# Patient Record
Sex: Female | Born: 1980 | Race: White | Hispanic: No | Marital: Married | Smoking: Never smoker
Health system: Southern US, Community
[De-identification: ages and names within clinical notes are randomized; demographics above are authoritative.]

## PROBLEM LIST (undated history)

## (undated) DIAGNOSIS — K602 Anal fissure, unspecified: Secondary | ICD-10-CM

## (undated) DIAGNOSIS — R011 Cardiac murmur, unspecified: Secondary | ICD-10-CM

## (undated) DIAGNOSIS — D649 Anemia, unspecified: Secondary | ICD-10-CM

## (undated) DIAGNOSIS — D696 Thrombocytopenia, unspecified: Secondary | ICD-10-CM

## (undated) DIAGNOSIS — Z9289 Personal history of other medical treatment: Secondary | ICD-10-CM

## (undated) DIAGNOSIS — A491 Streptococcal infection, unspecified site: Secondary | ICD-10-CM

## (undated) HISTORY — DX: Cardiac murmur, unspecified: R01.1

## (undated) HISTORY — DX: Anemia, unspecified: D64.9

## (undated) HISTORY — DX: Streptococcal infection, unspecified site: A49.1

## (undated) HISTORY — DX: Personal history of other medical treatment: Z92.89

## (undated) HISTORY — PX: WISDOM TOOTH EXTRACTION: SHX21

## (undated) HISTORY — DX: Anal fissure, unspecified: K60.2

---

## 1999-05-23 DIAGNOSIS — Z9289 Personal history of other medical treatment: Secondary | ICD-10-CM

## 1999-05-23 HISTORY — DX: Personal history of other medical treatment: Z92.89

## 2009-11-27 ENCOUNTER — Emergency Department (HOSPITAL_COMMUNITY): Admission: EM | Admit: 2009-11-27 | Discharge: 2009-11-27 | Payer: Self-pay | Admitting: Family Medicine

## 2009-12-05 ENCOUNTER — Emergency Department (HOSPITAL_COMMUNITY): Admission: EM | Admit: 2009-12-05 | Discharge: 2009-12-05 | Payer: Self-pay | Admitting: Family Medicine

## 2010-05-22 DIAGNOSIS — A491 Streptococcal infection, unspecified site: Secondary | ICD-10-CM

## 2010-05-22 HISTORY — DX: Streptococcal infection, unspecified site: A49.1

## 2010-10-15 ENCOUNTER — Inpatient Hospital Stay (HOSPITAL_COMMUNITY)
Admission: AD | Admit: 2010-10-15 | Discharge: 2010-10-17 | DRG: 373 | Disposition: A | Discharge status: Home / Self Care | Payer: BC Managed Care – PPO | Source: Ambulatory Visit | Attending: Obstetrics and Gynecology | Admitting: Obstetrics and Gynecology

## 2010-10-15 DIAGNOSIS — Z22330 Carrier of Group B streptococcus: Secondary | ICD-10-CM

## 2010-10-15 DIAGNOSIS — O99892 Other specified diseases and conditions complicating childbirth: Secondary | ICD-10-CM | POA: Diagnosis present

## 2010-10-15 LAB — CBC
HCT: 40.4 % (ref 36.0–46.0)
Hemoglobin: 14.4 g/dL (ref 12.0–15.0)
MCHC: 35.6 g/dL (ref 30.0–36.0)
MCV: 89.2 fL (ref 78.0–100.0)
RDW: 12.5 % (ref 11.5–15.5)
WBC: 12 10*3/uL — ABNORMAL HIGH (ref 4.0–10.5)

## 2010-10-16 LAB — CBC
HCT: 35.7 % — ABNORMAL LOW (ref 36.0–46.0)
MCH: 31 pg (ref 26.0–34.0)
MCV: 90.6 fL (ref 78.0–100.0)
Platelets: 148 10*3/uL — ABNORMAL LOW (ref 150–400)
RBC: 3.94 MIL/uL (ref 3.87–5.11)
WBC: 12.1 10*3/uL — ABNORMAL HIGH (ref 4.0–10.5)

## 2011-08-31 ENCOUNTER — Telehealth: Payer: Self-pay | Admitting: Obstetrics and Gynecology

## 2011-08-31 NOTE — Telephone Encounter (Signed)
PT CALLED REQUESTING RX FOR PROCTO CREAM HC, PT ADVISED NEEDS AN APPT BEFORE FILLING, HAS NOT BEEN SEEN IN OFFICE SINCE PP VISIT 01/2011. PT SAYS WILL CB IF APPT WANTED

## 2012-01-29 ENCOUNTER — Telehealth: Payer: Self-pay | Admitting: Obstetrics and Gynecology

## 2012-01-30 ENCOUNTER — Ambulatory Visit (INDEPENDENT_AMBULATORY_CARE_PROVIDER_SITE_OTHER): Payer: BC Managed Care – PPO | Admitting: Obstetrics and Gynecology

## 2012-01-30 ENCOUNTER — Encounter: Payer: Self-pay | Admitting: Obstetrics and Gynecology

## 2012-01-30 VITALS — BP 90/62 | HR 70 | Wt 104.0 lb

## 2012-01-30 DIAGNOSIS — A499 Bacterial infection, unspecified: Secondary | ICD-10-CM

## 2012-01-30 DIAGNOSIS — B9689 Other specified bacterial agents as the cause of diseases classified elsewhere: Secondary | ICD-10-CM

## 2012-01-30 DIAGNOSIS — N760 Acute vaginitis: Secondary | ICD-10-CM

## 2012-01-30 DIAGNOSIS — N898 Other specified noninflammatory disorders of vagina: Secondary | ICD-10-CM

## 2012-01-30 DIAGNOSIS — T192XXA Foreign body in vulva and vagina, initial encounter: Secondary | ICD-10-CM

## 2012-01-30 LAB — POCT WET PREP (WET MOUNT)

## 2012-01-30 MED ORDER — FLUCONAZOLE 150 MG PO TABS: 150.0000 mg | ORAL_TABLET | Freq: Once | ORAL | Status: AC

## 2012-01-30 MED ORDER — METRONIDAZOLE 500 MG PO TABS: 500.0000 mg | ORAL_TABLET | Freq: Two times a day (BID) | ORAL | Status: AC

## 2012-01-30 NOTE — Progress Notes (Signed)
Color: CLEAR Odor: yes Itching:no Thin:yes Thick:no Fever:no Dyspareunia:no Hx PID:no HX STD:no Pelvic Pain:no Desires Gc/CT:no Desires HIV,RPR,HbsAG:no    PT STATES THE DISCHARGE STARTED AFTER LAST CYCLE

## 2012-01-30 NOTE — Progress Notes (Signed)
31 YO complains of post menstrual vaginal discharge that is very thin and malodorous. Denies changes in bowel movements, urinary tract symptoms or pelvic pain.   O: Pelvic: EGBUS-wnl, vagina-retained tampon, thin malodorous discharge, uterus-normal size without tenderness, adnexae-no masses or tenderness      Wet Prep: pH-5.5, whiff-positive,  clue cells-many  A: Bacterial Vaginosis     Retained Tampon  P: Metronidazole 500mg  # 14 bid x 7 days no refills      Diflucan 150 mg #1 1 po stat 1 refill      RTO-as scheduled or prn  Mikaya Bunner, PA-C

## 2012-02-16 ENCOUNTER — Encounter: Payer: Self-pay | Admitting: Obstetrics and Gynecology

## 2012-02-16 ENCOUNTER — Ambulatory Visit (INDEPENDENT_AMBULATORY_CARE_PROVIDER_SITE_OTHER): Payer: BC Managed Care – PPO | Admitting: Obstetrics and Gynecology

## 2012-02-16 VITALS — BP 102/60 | Temp 97.7°F | Ht 63.5 in | Wt 107.0 lb

## 2012-02-16 DIAGNOSIS — Z124 Encounter for screening for malignant neoplasm of cervix: Secondary | ICD-10-CM

## 2012-02-16 DIAGNOSIS — Z01419 Encounter for gynecological examination (general) (routine) without abnormal findings: Secondary | ICD-10-CM

## 2012-02-16 NOTE — Patient Instructions (Signed)
Diaphragm A diaphragmis a soft, latex, dome-shaped barrier that is placed in the vagina with spermicidal jelly before sexual intercourse. It covers the cervix, kills sperm, and blocks the passage of sperm into the cervix. This method does not protect against sexually transmitted diseases (STDs). A diaphragm must be fitted by a caregiver during a pelvic exam. A caregiver will measure your vagina prior to prescribing a diaphragm. You will also learn about use, care, and problems of a diaphragm during the exam. If you have significant weight changes or become pregnant, it is important to have the diaphragm rechecked and possibly refitted. The diaphragm should be replaced every 2 years, or sooner if damaged. ADVANTAGES  You can use it while breastfeeding.   It is not felt by your sex partner.   It does not interfere with your female hormones.   It works immediately and is not permanent.  DISADVANTAGES  It is sometimes difficult to insert.   It may shift out of place during sexual intercourse.   You must have it fitted and refitted by your caregiver.  HOW TO INSERT A DIAPHRAGM 1. Check the diaphragm for holes by holding it up to the light, stretching the latex, or by filling it with water.  2. Place the spermicide cream or jelly inside the dome and around the rim of the diaphragm.  3. Squeeze the rim of the diaphragm and insert the diaphragm into the vagina.  4. The opening of the dome should face the cervix while inserting the diaphragm.  5. The front part (or top) of the rim should be behind the pubic bone and pushed over the top of the cervix.  6. Be sure the cervix is completely covered by reaching into your vagina and feeling the cervix behind the latex dome of the diaphragm. If you are uncomfortable, it is not inserted properly. Try inserting it again.  7. Leave the diaphragm in for 6 to 8 hours after intercourse. If intercourse occurs again within these 6 hours, another application of  spermicide is needed.  8. The diaphragm should not be left in place for longer than 24 hours.  HOME CARE INSTRUCTIONS   Wash the diaphragm with mild soap and warm water. Rinse thoroughly and dry completely after every use.   Only use water-based lubricants with the diaphragm. Oil-based lubricants can damage the diaphragm.   Do not use talc on the diaphragm.   Do not use the diaphragm if:   You had a baby in the last 2 months.   You have a vaginal infection.   You are having a menstrual period.   You had recent surgery on your cervix or vagina.   You have vaginal bleeding of unknown cause.   Your sex partner is allergic to latex or spermicides.  SEEK MEDICAL CARE IF:   You have pain during sexual intercourse when using the diaphragm.   The diaphragm slips out of place during sexual intercourse.   You have a fever.   You have blood in your urine.   You have burning or pain when you urinate.   You find a hole in the diaphragm.   You develop abnormal vaginal discharge.   You have vaginal itching or irritation.   You cannot remove the diaphragm.   You think you may be pregnant.   You need to be refitted for a diaphragm.  Document Released: 07/29/2002 Document Revised: 04/27/2011 Document Reviewed: 09/21/2010 [facility] Patient Information 2012 [place], [place].

## 2012-02-16 NOTE — Progress Notes (Signed)
Subjective:    Melanie Macias is a 31 y.o. female, G2P2, who presents for an annual exam. The patient reports no problems but wants to discuss contraceptive options that are not hormonal. Currently uses condoms.  Menstrual cycle:   LMP: Patient's last menstrual period was 02/04/2012.             Review of Systems Pertinent items are noted in HPI. Denies pelvic pain, urinary tract symptoms, vaginitis symptoms, irregular bleeding, menopausal symptoms, change in bowel habits or rectal bleeding   Objective:    BP 102/60  Temp 97.7 F (36.5 C) (Oral)  Ht 5' 3.5" (1.613 m)  Wt 107 lb (48.535 kg)  BMI 18.66 kg/m2  LMP 02/04/2012   Wt Readings from Last 1 Encounters:  02/16/12 107 lb (48.535 kg)   Body mass index is 18.66 kg/(m^2). General Appearance: Alert, no acute distress HEENT: Grossly normal Neck / Thyroid: Supple, no thyromegaly or cervical adenopathy Lungs: Clear to auscultation bilaterally Back: No CVA tenderness Breast Exam: No masses or nodes.No dimpling, nipple retraction or discharge. Cardiovascular: Regular rate and rhythm.  Gastrointestinal: Soft, non-tender, no masses or organomegaly Pelvic Exam: EGBUS-wnl, vagina-normal rugae, cervix- without lesions or tenderness, uterus appears normal size shape and consistency, adnexae-no masses or tenderness Lymphatic Exam: Non-palpable nodes in neck, clavicular,  axillary, or inguinal regions  Skin: no rashes or abnormalities Extremities: no clubbing cyanosis or edema  Neurologic: grossly normal Psychiatric: Alert and oriented    Assessment:   Routine GYN Exam   Plan:    PAP sent  Spent 15 minutes reviewing methods of contraception with patient (in addition to reviewing past and current related medical history) along with effectiveness, MOA, risks and benefits.  To consider diaphragm and Paragard IUD  but planning to become pregnant in the spring.  RTO 1 year or prn  Dove Gresham,ELMIRAPA-C

## 2012-02-16 NOTE — Progress Notes (Signed)
Regular Periods: yes Mammogram: no  Monthly Breast Ex.: yes Exercise: no  Tetanus < 10 years: no Seatbelts: yes  NI. Bladder Functn.: yes Abuse at home: no  Daily BM's: yes Stressful Work: no  Healthy Diet: yes Sigmoid-Colonoscopy: 1994  Calcium: no Medical problems this year: none   LAST PAP: 08/26/2009  Contraception: Condom  Mammogram:  n/a  PCP: [facility], Dr. Hyacinth Meeker  PMH: None  FMH: None  Last Bone Scan: n/a

## 2012-02-19 LAB — PAP IG W/ RFLX HPV ASCU

## 2012-05-20 ENCOUNTER — Ambulatory Visit (INDEPENDENT_AMBULATORY_CARE_PROVIDER_SITE_OTHER): Payer: BC Managed Care – PPO | Admitting: Obstetrics and Gynecology

## 2012-05-20 DIAGNOSIS — Z331 Pregnant state, incidental: Secondary | ICD-10-CM

## 2012-05-20 LAB — OB RESULTS CONSOLE GC/CHLAMYDIA
Chlamydia: NEGATIVE
Gonorrhea: NEGATIVE

## 2012-05-20 LAB — OB RESULTS CONSOLE GBS: GBS: NEGATIVE

## 2012-05-20 MED ORDER — ONDANSETRON 4 MG PO TBDP: 4.0000 mg | ORAL_TABLET | Freq: Three times a day (TID) | ORAL | Status: DC | PRN

## 2012-05-21 LAB — PRENATAL PANEL VII
Antibody Screen: NEGATIVE
Basophils Absolute: 0 10*3/uL (ref 0.0–0.1)
Eosinophils Relative: 1 % (ref 0–5)
HCT: 34.8 % — ABNORMAL LOW (ref 36.0–46.0)
HIV: NONREACTIVE
Hemoglobin: 12.2 g/dL (ref 12.0–15.0)
Lymphocytes Relative: 13 % (ref 12–46)
Lymphs Abs: 1.1 10*3/uL (ref 0.7–4.0)
MCV: 86.6 fL (ref 78.0–100.0)
Monocytes Absolute: 0.5 10*3/uL (ref 0.1–1.0)
Monocytes Relative: 7 % (ref 3–12)
Neutro Abs: 6.5 10*3/uL (ref 1.7–7.7)
RBC: 4.02 MIL/uL (ref 3.87–5.11)
Rh Type: POSITIVE
Rubella: 3.3 Index — ABNORMAL HIGH (ref <0.90)
WBC: 8.1 10*3/uL (ref 4.0–10.5)

## 2012-05-21 LAB — POCT URINALYSIS DIPSTICK
Bilirubin, UA: NEGATIVE
Ketones, UA: NEGATIVE
Leukocytes, UA: NEGATIVE
Spec Grav, UA: 1.02
pH, UA: 5

## 2012-05-21 NOTE — Progress Notes (Signed)
NOB interview completed.  PNV samples given.  Pt doing well. 

## 2012-05-22 NOTE — L&D Delivery Note (Signed)
Delivery Note At 7:04 PM a viable female was delivered via Vaginal, Spontaneous Delivery (Presentation: Left Occiput Anterior).  APGAR: 8, 10; weight pending.   Placenta status: Intact, spontaneously .  Cord:  with the following complications: .  Cord pH: not collected  Delivery done by Haroldine Laws, soon after Sanda Klein took over care and completed placental delivery and repair  Anesthesia: None  Episiotomy: None Lacerations: 2nd degree Suture Repair: 3.0 monocryl Est. Blood Loss (mL): 250  Mom to postpartum.  Baby to skin to skin with FOB while placenta was delivered.  Haroldine Laws 12/23/2012, 7:18 PM

## 2012-06-09 DIAGNOSIS — Z9289 Personal history of other medical treatment: Secondary | ICD-10-CM | POA: Insufficient documentation

## 2012-06-09 DIAGNOSIS — K602 Anal fissure, unspecified: Secondary | ICD-10-CM | POA: Insufficient documentation

## 2012-06-09 DIAGNOSIS — D649 Anemia, unspecified: Secondary | ICD-10-CM | POA: Insufficient documentation

## 2012-06-10 ENCOUNTER — Encounter: Payer: Self-pay | Admitting: Obstetrics and Gynecology

## 2012-06-10 ENCOUNTER — Ambulatory Visit: Payer: BC Managed Care – PPO | Admitting: Obstetrics and Gynecology

## 2012-06-10 VITALS — BP 100/60 | Wt 107.6 lb

## 2012-06-10 DIAGNOSIS — D649 Anemia, unspecified: Secondary | ICD-10-CM

## 2012-06-10 DIAGNOSIS — K602 Anal fissure, unspecified: Secondary | ICD-10-CM

## 2012-06-10 DIAGNOSIS — Z9289 Personal history of other medical treatment: Secondary | ICD-10-CM

## 2012-06-10 DIAGNOSIS — Z3480 Encounter for supervision of other normal pregnancy, unspecified trimester: Secondary | ICD-10-CM

## 2012-06-10 LAB — POCT WET PREP (WET MOUNT): pH: 5

## 2012-06-10 NOTE — Progress Notes (Signed)
Pt is here today for her NOB work-up. Last pap was 02/16/2012 wnl. Pt stated issues today.

## 2012-06-10 NOTE — Progress Notes (Signed)
   Melanie Macias is being seen today for her first obstetrical visit at [redacted]w[redacted]d gestation by LMP.  She reports she is doing well. Other two children are excited about the pregnancy. Planning another waterbirth, discussed that she did not need to go to the waterbirth class.  Her obstetrical history is significant for: Patient Active Problem List  Diagnosis  . H/O transfusion of packed red blood cells  . Hx Anemia  . Anal fissure    Relationship with FOB:  Married to [place], who is involved and supportive She is not employed  Feeding plan:   Breast  Pregnancy history fully reviewed.  The following portions of the patient's history were reviewed and updated as appropriate: allergies, current medications, past family history, past medical history, past social history, past surgical history and problem list.  Review of Systems Pertinent ROS is described in HPI   Objective:   BP 100/60  Wt 107 lb 9.6 oz (48.807 kg)  LMP 03/28/2012  Breastfeeding? Unknown Wt Readings from Last 1 Encounters:  06/10/12 107 lb 9.6 oz (48.807 kg)   BMI: There is no height on file to calculate BMI.  General: alert, cooperative and no distress HEENT: grossly normal  Thyroid: normal  Respiratory: clear to auscultation bilaterally Cardiovascular: regular rate and rhythm,  Breasts:  No dominant masses, nipples erect Gastrointestinal: soft, non-tender; no masses,  no organomegaly Extremities: extremities normal, no pain or edema Vaginal Bleeding: None  EXTERNAL GENITALIA: normal appearing vulva with no masses, tenderness or lesions VAGINA: no abnormal discharge or lesions CERVIX: no lesions or cervical motion tenderness; cervix closed, long, firm UTERUS: gravid and consistent with 10w 4d weeks ADNEXA: no masses palpable and nontender OB EXAM PELVIMETRY: appears adequate  FHR:  150  bpm  Assessment:    Pregnancy at  10w 4d Plans waterbirth  Plan:     Prenatal panel reviewed and discussed with  the patient:  yes  Pap smear collected:  No; last pap 01/2012 GC/Chlamydia collected:  yes Wet prep:  Done -- Neg Discussion of Genetic testing options: Declines Prenatal vitamins recommended Discussed waterbirth experience - may want to labor in water but may want to push out of tub due to feeling "hot" in last pushing experience. Plan of care: Next visit:  4 weeks for ROB Other anticipated f/u:   18 week [place] scheduled at [place]   Nigel Bridgeman, CNM, [place]

## 2012-06-11 LAB — GC/CHLAMYDIA PROBE AMP: CT Probe RNA: NEGATIVE

## 2012-06-25 ENCOUNTER — Encounter: Payer: BC Managed Care – PPO | Admitting: Obstetrics and Gynecology

## 2012-06-26 ENCOUNTER — Encounter: Payer: BC Managed Care – PPO | Admitting: Obstetrics and Gynecology

## 2012-07-09 ENCOUNTER — Ambulatory Visit: Payer: BC Managed Care – PPO | Admitting: Obstetrics and Gynecology

## 2012-07-09 VITALS — BP 108/60 | Wt 111.0 lb

## 2012-07-09 DIAGNOSIS — Z3490 Encounter for supervision of normal pregnancy, unspecified, unspecified trimester: Secondary | ICD-10-CM | POA: Insufficient documentation

## 2012-07-09 DIAGNOSIS — Z3689 Encounter for other specified antenatal screening: Secondary | ICD-10-CM

## 2012-07-09 DIAGNOSIS — D649 Anemia, unspecified: Secondary | ICD-10-CM

## 2012-07-09 NOTE — Progress Notes (Signed)
[redacted]w[redacted]d Pt w/o complaint today.  Declines genetic testing Has had flu vaccine Anatomy US [place]

## 2012-08-05 ENCOUNTER — Encounter: Payer: BC Managed Care – PPO | Admitting: Obstetrics and Gynecology

## 2012-08-05 ENCOUNTER — Other Ambulatory Visit: Payer: BC Managed Care – PPO

## 2012-12-23 ENCOUNTER — Inpatient Hospital Stay (HOSPITAL_COMMUNITY)
Admission: AD | Admit: 2012-12-23 | Discharge: 2012-12-24 | DRG: 373 | Disposition: A | Discharge status: Home / Self Care | Payer: BC Managed Care – PPO | Source: Ambulatory Visit | Attending: Obstetrics and Gynecology | Admitting: Obstetrics and Gynecology

## 2012-12-23 ENCOUNTER — Encounter (HOSPITAL_COMMUNITY): Payer: Self-pay | Admitting: *Deleted

## 2012-12-23 DIAGNOSIS — D696 Thrombocytopenia, unspecified: Secondary | ICD-10-CM | POA: Diagnosis present

## 2012-12-23 HISTORY — DX: Thrombocytopenia, unspecified: D69.6

## 2012-12-23 LAB — CBC
Hemoglobin: 13.8 g/dL (ref 12.0–15.0)
MCH: 31.6 pg (ref 26.0–34.0)
Platelets: 125 10*3/uL — ABNORMAL LOW (ref 150–400)
RBC: 4.37 MIL/uL (ref 3.87–5.11)
WBC: 10 10*3/uL (ref 4.0–10.5)

## 2012-12-23 MED ORDER — BENZOCAINE-MENTHOL 20-0.5 % EX AERO: 1.0000 "application " | INHALATION_SPRAY | CUTANEOUS | Status: DC | PRN

## 2012-12-23 MED ORDER — LANOLIN HYDROUS EX OINT: TOPICAL_OINTMENT | CUTANEOUS | Status: DC | PRN

## 2012-12-23 MED ORDER — TETANUS-DIPHTH-ACELL PERTUSSIS 5-2.5-18.5 LF-MCG/0.5 IM SUSP
0.5000 mL | Freq: Once | INTRAMUSCULAR | Status: AC
  Administered 2012-12-24: 0.5 mL via INTRAMUSCULAR

## 2012-12-23 MED ORDER — MEDROXYPROGESTERONE ACETATE 150 MG/ML IM SUSP: 150.0000 mg | INTRAMUSCULAR | Status: DC | PRN

## 2012-12-23 MED ORDER — SODIUM CHLORIDE 0.9 % IJ SOLN: 3.0000 mL | INTRAMUSCULAR | Status: DC | PRN

## 2012-12-23 MED ORDER — LACTATED RINGERS IV SOLN: 500.0000 mL | INTRAVENOUS | Status: DC | PRN

## 2012-12-23 MED ORDER — LACTATED RINGERS IV SOLN: INTRAVENOUS | Status: DC

## 2012-12-23 MED ORDER — OXYCODONE-ACETAMINOPHEN 5-325 MG PO TABS: 1.0000 | ORAL_TABLET | ORAL | Status: DC | PRN

## 2012-12-23 MED ORDER — WITCH HAZEL-GLYCERIN EX PADS: 1.0000 "application " | MEDICATED_PAD | CUTANEOUS | Status: DC | PRN

## 2012-12-23 MED ORDER — ONDANSETRON HCL 4 MG/2ML IJ SOLN: 4.0000 mg | Freq: Four times a day (QID) | INTRAMUSCULAR | Status: DC | PRN

## 2012-12-23 MED ORDER — IBUPROFEN 600 MG PO TABS
600.0000 mg | ORAL_TABLET | Freq: Four times a day (QID) | ORAL | Status: DC
  Administered 2012-12-24 (×3): 600 mg via ORAL
  Filled 2012-12-23 (×3): qty 1

## 2012-12-23 MED ORDER — LIDOCAINE HCL (PF) 1 % IJ SOLN
30.0000 mL | INTRAMUSCULAR | Status: DC | PRN
  Administered 2012-12-23: 30 mL via SUBCUTANEOUS
  Filled 2012-12-23 (×2): qty 30

## 2012-12-23 MED ORDER — IBUPROFEN 600 MG PO TABS
600.0000 mg | ORAL_TABLET | Freq: Four times a day (QID) | ORAL | Status: DC | PRN
  Administered 2012-12-23: 600 mg via ORAL
  Filled 2012-12-23: qty 1

## 2012-12-23 MED ORDER — FLEET ENEMA 7-19 GM/118ML RE ENEM: 1.0000 | ENEMA | Freq: Every day | RECTAL | Status: DC | PRN

## 2012-12-23 MED ORDER — SIMETHICONE 80 MG PO CHEW: 80.0000 mg | CHEWABLE_TABLET | ORAL | Status: DC | PRN

## 2012-12-23 MED ORDER — CITRIC ACID-SODIUM CITRATE 334-500 MG/5ML PO SOLN: 30.0000 mL | ORAL | Status: DC | PRN

## 2012-12-23 MED ORDER — ONDANSETRON HCL 4 MG/2ML IJ SOLN: 4.0000 mg | INTRAMUSCULAR | Status: DC | PRN

## 2012-12-23 MED ORDER — DIBUCAINE 1 % RE OINT: 1.0000 "application " | TOPICAL_OINTMENT | RECTAL | Status: DC | PRN

## 2012-12-23 MED ORDER — ACETAMINOPHEN 325 MG PO TABS: 650.0000 mg | ORAL_TABLET | ORAL | Status: DC | PRN

## 2012-12-23 MED ORDER — SODIUM CHLORIDE 0.9 % IJ SOLN: 3.0000 mL | Freq: Two times a day (BID) | INTRAMUSCULAR | Status: DC

## 2012-12-23 MED ORDER — MEASLES, MUMPS & RUBELLA VAC ~~LOC~~ INJ
0.5000 mL | INJECTION | Freq: Once | SUBCUTANEOUS | Status: DC
  Filled 2012-12-23: qty 0.5

## 2012-12-23 MED ORDER — SENNOSIDES-DOCUSATE SODIUM 8.6-50 MG PO TABS
2.0000 | ORAL_TABLET | Freq: Every day | ORAL | Status: DC
  Administered 2012-12-23: 2 via ORAL

## 2012-12-23 MED ORDER — ZOLPIDEM TARTRATE 5 MG PO TABS: 5.0000 mg | ORAL_TABLET | Freq: Every evening | ORAL | Status: DC | PRN

## 2012-12-23 MED ORDER — ONDANSETRON HCL 4 MG PO TABS: 4.0000 mg | ORAL_TABLET | ORAL | Status: DC | PRN

## 2012-12-23 MED ORDER — OXYTOCIN 40 UNITS IN LACTATED RINGERS INFUSION - SIMPLE MED: 62.5000 mL/h | INTRAVENOUS | Status: DC

## 2012-12-23 MED ORDER — BISACODYL 10 MG RE SUPP: 10.0000 mg | Freq: Every day | RECTAL | Status: DC | PRN

## 2012-12-23 MED ORDER — DIPHENHYDRAMINE HCL 25 MG PO CAPS: 25.0000 mg | ORAL_CAPSULE | Freq: Four times a day (QID) | ORAL | Status: DC | PRN

## 2012-12-23 MED ORDER — PRENATAL MULTIVITAMIN CH: 1.0000 | ORAL_TABLET | Freq: Every day | ORAL | Status: DC

## 2012-12-23 MED ORDER — PRENATAL MULTIVITAMIN CH
1.0000 | ORAL_TABLET | Freq: Every day | ORAL | Status: DC
  Administered 2012-12-24: 1 via ORAL
  Filled 2012-12-23: qty 1

## 2012-12-23 MED ORDER — OXYTOCIN BOLUS FROM INFUSION: 500.0000 mL | INTRAVENOUS | Status: DC

## 2012-12-23 MED ORDER — SODIUM CHLORIDE 0.9 % IV SOLN: 250.0000 mL | INTRAVENOUS | Status: DC | PRN

## 2012-12-23 NOTE — Progress Notes (Signed)
  Subjective: Pt in tub managing UCs well.  Objective: BP 124/87  Pulse 89  Temp(Src) 98.4 F (36.9 C) (Oral)  Resp 16  Ht 5\' 3"  (1.6 m)  Wt 131 lb (59.421 kg)  BMI 23.21 kg/m2  LMP 03/28/2012      SVE:   9.5 / 100% / 0  Assessment / Plan:  Labor: Active labor Preeclampsia: no s/s Fetal Wellbeing: IA 130s Pain Control: Water, breathing and relaxation  I/D: GBS neg Anticipated MOD: SVD   Katheryne Gorr 12/23/2012, 6:49 PM

## 2012-12-23 NOTE — H&P (Signed)
JAQUELYNE FIRKUS is a 32 y.o. female, G3P2002 at [redacted]w[redacted]d, presenting for labor.  Denies VB, LOF, recent fever, resp or GI c/o's, UTI or PIH s/s. GFM.  Patient Active Problem List   Diagnosis Date Noted  . Pregnancy 07/09/2012  . H/O transfusion of packed red blood cells 06/09/2012  . Hx Anemia 06/09/2012  . Anal fissure 06/09/2012    History of present pregnancy: Patient entered care at 7 weeks.   EDC of 01/02/13 was established by LMP.   Anatomy scan:  [redacted]w[redacted]d, with normal findings and a posterior fundal placenta.   Additional [place] evaluations:  [redacted]w[redacted]d for S<D - EFW 60th%ile and AFI 50th%ile.   Significant prenatal events:  none   Last evaluation:  12/23/12 at [redacted]w[redacted]d  4cm / 90 / -1  OB History   Grav Para Term Preterm Abortions TAB SAB Ect Mult Living   3 2 2       2      Past Medical History  Diagnosis Date  . Anal fissure   . Preterm labor     with both pregnancies around 26 weeks;was given Terb injection and partial bedrest for both;then goes away @ around 30 weeks  . Anemia     Currently taking FeSO4 supp  . Heart murmur     @ birth;grew out of  . History of blood transfusion 2001    Hgb @ 4.5  . GBS (group B streptococcus) infection 2012   Past Surgical History  Procedure Laterality Date  . Wisdom tooth extraction     Family History: family history includes Bipolar disorder in her paternal aunt; Breast cancer (age of onset: 44) in her maternal aunt; Congestive Heart Failure in her maternal grandfather and maternal grandmother; Dementia in her maternal grandmother; Diabetes in her maternal grandfather and maternal grandmother; Hypertension in her paternal grandmother; Lung cancer in her father; Seizures in her mother; and Stroke in her maternal grandmother. Social History:  reports that she has never smoked. She has never used smokeless tobacco. She reports that she does not drink alcohol or use illicit drugs.   Prenatal Transfer Tool  Maternal Diabetes: No Genetic Screening:  Declined Maternal Ultrasounds/Referrals: Normal Fetal Ultrasounds or other Referrals:  None Maternal Substance Abuse:  No Significant Maternal Medications:  None Significant Maternal Lab Results: Lab values include: Group B Strep negative    ROS: see HPI above, all other systems are negative  No Known Allergies   Dilation: 7 Effacement (%): 90 Station: 0 Blood pressure 137/88, pulse 88, temperature 97.8 F (36.6 C), temperature source Oral, resp. rate 16, height 5\' 3"  (1.6 m), weight 131 lb (59.421 kg), last menstrual period 03/28/2012, unknown if currently breastfeeding.  Chest clear Heart RRR without murmur Abd gravid, NT Ext: WNL  FHR: IA UCs:  Approx. Q 10 min  Prenatal labs: ABO, Rh: O/POS/-- (12/30 1543) Antibody: NEG (12/30 1543) Rubella:   Immune RPR: NON REAC (12/30 1543)  HBsAg: NEGATIVE (12/30 1543)  HIV: NON REACTIVE (12/30 1543)  GBS:  Neg Sickle cell/Hgb electrophoresis:  n/a Pap:  Last pap 01/2012  WNL GC:  Neg Chlamydia:  Neg Genetic screenings:  Declined Glucola:  85 Other:  None  Assessment/Plan: IUP at [redacted]w[redacted]d Active labor Waterbirth GBS neg  Admit to BS per c/w with Dr. Pennie Rushing Routine [facility] orders IA fetal monitoring    Rowan Blase, MSN 12/23/2012, 3:50 PM

## 2012-12-23 NOTE — Progress Notes (Addendum)
This note also relates to the following rows which could not be included: Pulse Rate - Cannot attach notes to unvalidated device data SpO2 - Cannot attach notes to unvalidated device data   Audible fhr murmur noted

## 2012-12-24 ENCOUNTER — Encounter (HOSPITAL_COMMUNITY): Payer: Self-pay

## 2012-12-24 LAB — CBC
HCT: 36.5 % (ref 36.0–46.0)
MCH: 31.2 pg (ref 26.0–34.0)
MCHC: 34.8 g/dL (ref 30.0–36.0)
RDW: 12.5 % (ref 11.5–15.5)

## 2012-12-24 LAB — RPR: RPR Ser Ql: NONREACTIVE

## 2012-12-24 MED ORDER — IBUPROFEN 600 MG PO TABS: 600.0000 mg | ORAL_TABLET | Freq: Four times a day (QID) | ORAL | Status: DC | PRN

## 2012-12-24 NOTE — Discharge Summary (Signed)
Obstetric Discharge Summary Reason for Admission: onset of labor at [redacted]w[redacted]d Prenatal Procedures: ultrasound Intrapartum Procedures: spontaneous vaginal delivery Postpartum Procedures: none Complications-Operative and Postpartum: 2nd degree perineal laceration Hemoglobin  Date Value Range Status  12/24/2012 12.7  12.0 - 15.0 g/dL Final     HCT  Date Value Range Status  12/24/2012 36.5  36.0 - 46.0 % Final    Physical Exam:  General: alert, cooperative and no distress Lochia: appropriate, rubra Uterine Fundus: firm, below umbilicus Incision: n/a DVT Evaluation: No evidence of DVT seen on physical exam. Negative Homan's sign. No significant calf/ankle edema. Hospital Course: Pt was admitted on the afternoon of 12/23/12 at [redacted]w[redacted]d in active labor, GBS neg, and cx 7cm on admission.  Her labor progressed spontaneously w/o intervention.  She had her 2nd successful waterbirth; SVD at 27 by Haroldine Laws, CNM.  Third stage and repair by on-coming CNM, Sanda Klein.  Her Platelet count was low on admission=125k.  Her PP course was unremarkable and she verbalized desire for early d/c.  VB was WNL and lactation was going well.  Her platelets had risen on day 1 to 136k.  She is considering an IUD for future contraception.    Discharge Diagnoses: Term Pregnancy-delivered and successful waterbirth; lactating; thrombocytopenia  Discharge Information: Date: 12/24/2012 PPD#1 Activity: pelvic rest Diet: routine Medications: PNV, Ibuprofen and tylenol prn additional pain relief. stool softner prn. Condition: stable Instructions: refer to practice specific booklet Discharge to: home Follow-up Information   Follow up with [facility]. Schedule an appointment as soon as possible for a visit in 6 weeks. (or call as needed with any questions or concerns)    Contact information:   [address] [place] [address] [phone]      Newborn Data: Live  born female (delivery provider: Haroldine Laws, CNM; 3rd stage and repair by Jackson Latino, CNM) Birth Weight: 7 lb 11 oz (3487 g) APGAR: 8, 10  Home with mother.  Melanie Macias 12/24/2012, 6:30 PM

## 2013-01-03 ENCOUNTER — Encounter (HOSPITAL_COMMUNITY): Payer: Self-pay

## 2013-01-03 DIAGNOSIS — D696 Thrombocytopenia, unspecified: Secondary | ICD-10-CM

## 2013-01-03 HISTORY — DX: Thrombocytopenia, unspecified: D69.6

## 2014-03-23 ENCOUNTER — Encounter (HOSPITAL_COMMUNITY): Payer: Self-pay

## 2016-10-25 ENCOUNTER — Encounter (HOSPITAL_COMMUNITY): Payer: Self-pay | Admitting: Obstetrics and Gynecology

## 2019-05-23 NOTE — L&D Delivery Note (Signed)
OB/GYN Faculty Practice Delivery Note  Melanie Macias is a 39 y.o. now G2I9485 s/p SVD of twins at [redacted]w[redacted]d who was admitted for PPROM and contractions.   ROM: 6h 47m with clear fluid GBS Status: Unknown - no abx treatment Maximum Maternal Temperature: 97.8    Annika, Selke [[id number]]  Delivery Note At 6:10 AM a viable female was delivered via Vaginal, Spontaneous (Presentation:  Occiput Anterior).  APGAR: 9, 10; weight 7 lbs 1.4 oz.   Placenta status: Spontaneous via Tomasa Blase, Intact.  Cord: 3 vessels with no complications.  Cord pH: n/a  Anesthesia: Epidural Episiotomy: None Lacerations: 1st degree;Vaginal;Perineal Suture Repair: 2.0 vicryl rapide Est. Blood Loss (mL):  150     Shandy, Checo [[id number]]  Delivery Note At [phone] BOW was at -3 station with feet palpable. Bedside U/S was performed by Dr. Alysia Penna and Pecola Leisure B was found to be in a footling breech position with head to maternal left and in OA position. BOW was then allowed to labor down to introitus. Pitocin continued at 4 mU/hr. Contractions every 2-3 mins. Cervical exam performed and cervix remained completely dilated and BOW at introitus. AROM was done @ 0625 with copious amounts of clear fluid in return. RT foot delivering through introitus and no cord identified. Manual breech extraction was performed. Delivery of body to arms. Warm, wet towel wrapped around body and held firmly in place by CNM. Body rotated 45 degrees and posterior arm delivered then anterior arm delivered without difficulty. CNM hand entered vagina from posterior and mouth and chin brought to baby's chest with pointer finger and head delivered without difficulty in upward motion  At 6:33 AM a viable female was delivered via Vaginal, Breech (Presentation: Footling Breech).  APGAR: 6, 9; weight 6 lbs 11.2 oz (3039 gm).   Placenta status: Spontaneous, Intact.  Cord: 3VC clamp left attached to identify cord of Twin B.  Cord pH: n/a  Anesthesia: Epidural   Episiotomy: None Lacerations: 1st degree;Vaginal;Perineal Suture Repair: 2.0 vicryl rapide Est. Blood Loss (mL):    Postpartum Planning [x]  Mom to postpartum.  Baby to Couplet care / Skin to Skin.  [x]  plans to breast feed [x]  message to sent to schedule follow-up  [x]  vaccines UTD  , MSN, CNM 12/27/19, 7:23 AM

## 2019-06-04 ENCOUNTER — Encounter: Payer: Self-pay | Admitting: Medical

## 2019-06-04 ENCOUNTER — Other Ambulatory Visit: Payer: Self-pay

## 2019-06-04 ENCOUNTER — Ambulatory Visit (INDEPENDENT_AMBULATORY_CARE_PROVIDER_SITE_OTHER): Payer: Self-pay

## 2019-06-04 DIAGNOSIS — Z3201 Encounter for pregnancy test, result positive: Secondary | ICD-10-CM

## 2019-06-04 DIAGNOSIS — Z3200 Encounter for pregnancy test, result unknown: Secondary | ICD-10-CM

## 2019-06-04 LAB — POCT PREGNANCY, URINE: Preg Test, Ur: POSITIVE — AB

## 2019-06-04 NOTE — Progress Notes (Signed)
Pt here today for UPT; result is positive. Results and dating reviewed with patient. LMP 04/21/19 with EDD 01/26/20; pt is 6w 2d today. Medications and allergies reviewed with pt. Pt is currently taking acyclovir as needed; okay to continue during pregnancy per Alysia Penna, MD. Pt to dc use of chlorpheniramine per Alysia Penna, MD. List of otc medications safe to take during pregnancy given.   Pt inquired about water birth. Explained to pt that all water births have been suspended due to COVID-19. Pt is interested in beginning care in our office. Her preference is to see midwives for her prenatal care. Front office notified to provide proof of pregnancy letter and to schedule new OB appts.   Fleet Contras RN 06/04/19

## 2019-06-05 NOTE — Progress Notes (Signed)
Agree with A & P. 

## 2019-06-30 LAB — OB RESULTS CONSOLE HGB/HCT, BLOOD
HCT: 31 (ref 29–41)
Hemoglobin: 10.4

## 2019-06-30 LAB — OB RESULTS CONSOLE HEPATITIS B SURFACE ANTIGEN: Hepatitis B Surface Ag: NEGATIVE

## 2019-06-30 LAB — OB RESULTS CONSOLE RUBELLA ANTIBODY, IGM: Rubella: NON-IMMUNE/NOT IMMUNE

## 2019-06-30 LAB — HIV ANTIBODY (ROUTINE TESTING W REFLEX): HIV Screen 4th Generation wRfx: NONREACTIVE

## 2019-06-30 LAB — OB RESULTS CONSOLE PLATELET COUNT: Platelets: 264

## 2019-06-30 LAB — OB RESULTS CONSOLE GC/CHLAMYDIA
Chlamydia: NEGATIVE
Gonorrhea: NEGATIVE

## 2019-06-30 LAB — OB RESULTS CONSOLE RPR: RPR: NONREACTIVE

## 2019-07-18 ENCOUNTER — Encounter: Payer: Self-pay | Admitting: Family Medicine

## 2019-09-04 DIAGNOSIS — O0990 Supervision of high risk pregnancy, unspecified, unspecified trimester: Secondary | ICD-10-CM

## 2019-09-04 DIAGNOSIS — O30039 Twin pregnancy, monochorionic/diamniotic, unspecified trimester: Secondary | ICD-10-CM | POA: Insufficient documentation

## 2019-09-23 ENCOUNTER — Encounter: Payer: Medicaid Other | Admitting: Obstetrics & Gynecology

## 2019-10-02 ENCOUNTER — Other Ambulatory Visit: Payer: Self-pay

## 2019-10-02 ENCOUNTER — Encounter: Payer: Self-pay | Admitting: Family Medicine

## 2019-10-02 ENCOUNTER — Ambulatory Visit (INDEPENDENT_AMBULATORY_CARE_PROVIDER_SITE_OTHER): Payer: Medicaid Other | Admitting: Family Medicine

## 2019-10-02 ENCOUNTER — Encounter: Payer: Self-pay | Admitting: General Practice

## 2019-10-02 DIAGNOSIS — O30032 Twin pregnancy, monochorionic/diamniotic, second trimester: Secondary | ICD-10-CM | POA: Diagnosis not present

## 2019-10-02 DIAGNOSIS — Z3A23 23 weeks gestation of pregnancy: Secondary | ICD-10-CM

## 2019-10-02 DIAGNOSIS — O0992 Supervision of high risk pregnancy, unspecified, second trimester: Secondary | ICD-10-CM

## 2019-10-02 DIAGNOSIS — O0990 Supervision of high risk pregnancy, unspecified, unspecified trimester: Secondary | ICD-10-CM

## 2019-10-02 LAB — POCT URINALYSIS DIP (DEVICE)
Bilirubin Urine: NEGATIVE
Glucose, UA: NEGATIVE mg/dL
Hgb urine dipstick: NEGATIVE
Ketones, ur: NEGATIVE mg/dL
Leukocytes,Ua: NEGATIVE
Nitrite: NEGATIVE
Protein, ur: NEGATIVE mg/dL
Specific Gravity, Urine: 1.025 (ref 1.005–1.030)
Urobilinogen, UA: 0.2 mg/dL (ref 0.0–1.0)
pH: 7 (ref 5.0–8.0)

## 2019-10-02 MED ORDER — BLOOD PRESSURE KIT DEVI: 1.0000 | Freq: Once | 0 refills | Status: AC

## 2019-10-02 NOTE — Patient Instructions (Signed)

## 2019-10-02 NOTE — Addendum Note (Signed)
Addended by: Marjo Bicker on: 10/02/2019 06:11 PM   Modules accepted: Orders

## 2019-10-02 NOTE — Progress Notes (Signed)
   PRENATAL VISIT NOTE  Subjective:  Melanie Macias is a 39 y.o. [phone] at [redacted]w[redacted]d being seen today for transferring prenatal care.  She is currently monitored for the following issues for this high-risk pregnancy and has H/O transfusion of packed red blood cells; Hx Anemia; Anal fissure; Thrombocytopenia, unspecified (HCC); Supervision of high risk pregnancy, antepartum; and Monochorionic diamniotic twin gestation on their problem list.  Patient reports no complaints.  Contractions: Irritability. Vag. Bleeding: None.  Movement: Present. Denies leaking of fluid.   The following portions of the patient's history were reviewed and updated as appropriate: allergies, current medications, past family history, past medical history, past social history, past surgical history and problem list.   Objective:   Vitals:   10/02/19 1349  BP: 126/62  Pulse: 86  Weight: 136 lb 12.8 oz (62.1 kg)    Fetal Status: Fetal Heart Rate (bpm): 139/153   Movement: Present     General:  Alert, oriented and cooperative. Patient is in no acute distress.  Skin: Skin is warm and dry. No rash noted.   Cardiovascular: Normal heart rate noted  Respiratory: Normal respiratory effort, no problems with respiration noted  Abdomen: Soft, gravid, appropriate for gestational age.  Pain/Pressure: Present     Pelvic: Cervical exam deferred        Extremities: Normal range of motion.  Edema: None  Mental Status: Normal mood and affect. Normal behavior. Normal judgment and thought content.   Assessment and Plan:  Pregnancy: G4P3003 at [redacted]w[redacted]d 1. Supervision of high risk pregnancy, antepartum Transfer from [employer] due to pregnancy Medicaid needs q 2 wks u/s--arranged with MFM - [facility] AMB BABYSCRIPTS SCHEDULE OPTIMIZATION  2. Monochorionic diamniotic twin gestation in second trimester Has h/o subchorionic hemorrhage and low lying placenta last at 1.7 cm from os - [place] MFM OB DETAIL +14 WK; Future - [place] MFM OB DETAIL ADDL GEST +14  WK; Future  Preterm labor symptoms and general obstetric precautions including but not limited to vaginal bleeding, contractions, leaking of fluid and fetal movement were reviewed in detail with the patient. Please refer to After Visit Summary for other counseling recommendations.   Return in about 4 weeks (around 10/30/2019) for in person twins, needs U/S asap, 28 wk labs.  Future Appointments  Date Time Provider Department Center  10/03/2019  7:30 AM [facility] NURSE [facility]  10/03/2019  7:30 AM [facility] US1 [facility]  10/28/2019  8:15 AM Malachy Chamber, MD [facility]  10/28/2019  8:50 AM [facility] LAB [facility]    Reva Bores, MD

## 2019-10-03 ENCOUNTER — Other Ambulatory Visit: Payer: Self-pay | Admitting: *Deleted

## 2019-10-03 ENCOUNTER — Ambulatory Visit: Payer: Medicaid Other | Attending: Obstetrics and Gynecology

## 2019-10-03 ENCOUNTER — Ambulatory Visit: Payer: Medicaid Other | Admitting: *Deleted

## 2019-10-03 ENCOUNTER — Encounter: Payer: Self-pay | Admitting: Family Medicine

## 2019-10-03 DIAGNOSIS — O4442 Low lying placenta NOS or without hemorrhage, second trimester: Secondary | ICD-10-CM | POA: Diagnosis not present

## 2019-10-03 DIAGNOSIS — Z363 Encounter for antenatal screening for malformations: Secondary | ICD-10-CM

## 2019-10-03 DIAGNOSIS — O30039 Twin pregnancy, monochorionic/diamniotic, unspecified trimester: Secondary | ICD-10-CM

## 2019-10-03 DIAGNOSIS — O30032 Twin pregnancy, monochorionic/diamniotic, second trimester: Secondary | ICD-10-CM

## 2019-10-03 DIAGNOSIS — O09522 Supervision of elderly multigravida, second trimester: Secondary | ICD-10-CM | POA: Diagnosis present

## 2019-10-03 DIAGNOSIS — O09529 Supervision of elderly multigravida, unspecified trimester: Secondary | ICD-10-CM | POA: Insufficient documentation

## 2019-10-03 DIAGNOSIS — O0990 Supervision of high risk pregnancy, unspecified, unspecified trimester: Secondary | ICD-10-CM | POA: Diagnosis present

## 2019-10-03 DIAGNOSIS — Z3A23 23 weeks gestation of pregnancy: Secondary | ICD-10-CM | POA: Diagnosis present

## 2019-10-03 NOTE — Progress Notes (Signed)
MFM Note  This patient was seen due to a spontaneously conceived twin pregnancy and advanced maternal age.  She denies any significant past medical history and denies any problems in her current pregnancy.  She reports 3 prior full-term uncomplicated vaginal deliveries.  The patient reports that she had received prenatal care earlier in her pregnancy with [facility].  She had a cell free DNA test earlier in her pregnancy which indicated a low risk for trisomy 69, 52, and 13.  Two female fetus are predicted.  These are predicted to be monozygotic twins.  A thin dividing membrane was noted separating the two fetuses along with a single placenta, indicating that these are monochorionic, diamniotic twins.  The fetal growth and amniotic fluid level appeared appropriate for her gestational age for both fetuses.  The views of the fetal anatomy were suboptimal today due to the fetal positions.  However, there were no obvious anomalies suspected in either fetus.  A possible velamentous/marginal cord insertions were noted.  We will continue to follow her closely to assess these findings.  The limitations of ultrasound in the detection of all anomalies including fetal aneuploidy was discussed with the patient today.  The increased risk of fetal aneuploidy due to advanced maternal age was discussed. Due to advanced maternal age, the patient was offered and declined an amniocentesis today for definitive diagnosis of fetal aneuploidy. The implications and management of monochorionic twins was discussed. The 10% to 15% risk of twin to twin transfusion syndrome seen in monochorionic, diamniotic twins was discussed today.  The implications and management of twin to twin transfusion syndrome (TTTS) should she develop this complication was also discussed.  She was advised that we will continue to follow her closely with serial ultrasounds to assess for signs of TTTS.  There were no signs of the twin to twin  transfusion syndrome noted today.  She was advised that management of twin pregnancies will involve frequent ultrasound exams to assess the fetal growth and amniotic fluid level.  We will continue to follow her with biweekly ultrasounds to assess for signs of the twin to twin transfusion syndrome. Weekly fetal testing should be started at around 32 weeks.  Delivery for uncomplicated monochorionic twins is recommended at around 37 weeks.  The increased risk of preeclampsia, gestational diabetes, and preterm birth/labor associated with twin pregnancies was discussed.  She was advised that we will continue to follow her closely to assess for these conditions. As pregnancies with multiple gestations are at increased risk for developing preeclampsia, she was advised to start taking a daily baby aspirin (81 mg per day) to decrease her risk of developing preeclampsia  as soon as possible.  A follow-up exam was scheduled in 2 weeks.    Due to the monochorionic twin gestation, she was also referred for a fetal echocardiogram with [facility].  A total of 30 minutes was spent counseling and coordinating the care for this patient.  Greater than 50% of the time was spent in direct face-to-face contact.

## 2019-10-07 ENCOUNTER — Encounter: Payer: Self-pay | Admitting: Pediatric Cardiology

## 2019-10-10 ENCOUNTER — Telehealth: Payer: Self-pay | Admitting: *Deleted

## 2019-10-10 MED ORDER — ACYCLOVIR 400 MG PO TABS
ORAL_TABLET | ORAL | 1 refills | Status: DC
Start: 2019-10-10 — End: 2019-11-14

## 2019-10-10 NOTE — Telephone Encounter (Signed)
Pt left VM message requesting refill of Acyclovir due to sx of fever blister outbreak. Rx approved per Vonzella Nipple, PA. Pt was called and notified of Rx sent to pharmacy. She voiced understanding

## 2019-10-14 ENCOUNTER — Other Ambulatory Visit: Payer: Self-pay | Admitting: *Deleted

## 2019-10-14 ENCOUNTER — Ambulatory Visit: Payer: Medicaid Other | Admitting: *Deleted

## 2019-10-14 ENCOUNTER — Ambulatory Visit: Payer: Medicaid Other | Attending: Obstetrics and Gynecology

## 2019-10-14 ENCOUNTER — Other Ambulatory Visit: Payer: Self-pay

## 2019-10-14 ENCOUNTER — Encounter: Payer: Self-pay | Admitting: *Deleted

## 2019-10-14 DIAGNOSIS — Z3A25 25 weeks gestation of pregnancy: Secondary | ICD-10-CM

## 2019-10-14 DIAGNOSIS — O09522 Supervision of elderly multigravida, second trimester: Secondary | ICD-10-CM

## 2019-10-14 DIAGNOSIS — O30039 Twin pregnancy, monochorionic/diamniotic, unspecified trimester: Secondary | ICD-10-CM | POA: Insufficient documentation

## 2019-10-14 DIAGNOSIS — O0990 Supervision of high risk pregnancy, unspecified, unspecified trimester: Secondary | ICD-10-CM | POA: Diagnosis present

## 2019-10-14 DIAGNOSIS — O4442 Low lying placenta NOS or without hemorrhage, second trimester: Secondary | ICD-10-CM | POA: Diagnosis not present

## 2019-10-14 DIAGNOSIS — O30032 Twin pregnancy, monochorionic/diamniotic, second trimester: Secondary | ICD-10-CM

## 2019-10-14 DIAGNOSIS — O43199 Other malformation of placenta, unspecified trimester: Secondary | ICD-10-CM

## 2019-10-15 ENCOUNTER — Other Ambulatory Visit: Payer: Medicaid Other

## 2019-10-27 ENCOUNTER — Telehealth: Payer: Self-pay

## 2019-10-27 NOTE — Telephone Encounter (Signed)
Pt called and stated that she is [redacted] wks pregnant with twins and she is having a tiny bit of bleeding.  Pt concern addressed via Mychart.  Verified that pt has read message.    Addison Naegeli, RN

## 2019-10-28 ENCOUNTER — Ambulatory Visit: Payer: Medicaid Other | Attending: Obstetrics and Gynecology

## 2019-10-28 ENCOUNTER — Other Ambulatory Visit: Payer: Self-pay | Admitting: *Deleted

## 2019-10-28 ENCOUNTER — Other Ambulatory Visit: Payer: Medicaid Other

## 2019-10-28 ENCOUNTER — Encounter: Payer: Medicaid Other | Admitting: Obstetrics & Gynecology

## 2019-10-28 ENCOUNTER — Other Ambulatory Visit: Payer: Self-pay

## 2019-10-28 ENCOUNTER — Ambulatory Visit: Payer: Medicaid Other | Admitting: *Deleted

## 2019-10-28 DIAGNOSIS — O43199 Other malformation of placenta, unspecified trimester: Secondary | ICD-10-CM | POA: Diagnosis present

## 2019-10-28 DIAGNOSIS — O09522 Supervision of elderly multigravida, second trimester: Secondary | ICD-10-CM

## 2019-10-28 DIAGNOSIS — O30039 Twin pregnancy, monochorionic/diamniotic, unspecified trimester: Secondary | ICD-10-CM | POA: Insufficient documentation

## 2019-10-28 DIAGNOSIS — O0990 Supervision of high risk pregnancy, unspecified, unspecified trimester: Secondary | ICD-10-CM

## 2019-10-28 DIAGNOSIS — O4442 Low lying placenta NOS or without hemorrhage, second trimester: Secondary | ICD-10-CM

## 2019-10-28 DIAGNOSIS — O30032 Twin pregnancy, monochorionic/diamniotic, second trimester: Secondary | ICD-10-CM

## 2019-10-28 DIAGNOSIS — Z3A27 27 weeks gestation of pregnancy: Secondary | ICD-10-CM

## 2019-10-29 ENCOUNTER — Encounter: Payer: Medicaid Other | Admitting: Obstetrics and Gynecology

## 2019-10-30 ENCOUNTER — Ambulatory Visit (INDEPENDENT_AMBULATORY_CARE_PROVIDER_SITE_OTHER): Payer: Medicaid Other | Admitting: Family Medicine

## 2019-10-30 ENCOUNTER — Other Ambulatory Visit: Payer: Medicaid Other

## 2019-10-30 ENCOUNTER — Other Ambulatory Visit: Payer: Self-pay

## 2019-10-30 VITALS — BP 103/69 | HR 87 | Wt 139.0 lb

## 2019-10-30 DIAGNOSIS — Z23 Encounter for immunization: Secondary | ICD-10-CM | POA: Diagnosis not present

## 2019-10-30 DIAGNOSIS — O0990 Supervision of high risk pregnancy, unspecified, unspecified trimester: Secondary | ICD-10-CM

## 2019-10-30 DIAGNOSIS — O09522 Supervision of elderly multigravida, second trimester: Secondary | ICD-10-CM

## 2019-10-30 DIAGNOSIS — O30032 Twin pregnancy, monochorionic/diamniotic, second trimester: Secondary | ICD-10-CM

## 2019-10-30 DIAGNOSIS — O0992 Supervision of high risk pregnancy, unspecified, second trimester: Secondary | ICD-10-CM

## 2019-10-30 DIAGNOSIS — D696 Thrombocytopenia, unspecified: Secondary | ICD-10-CM

## 2019-10-30 DIAGNOSIS — O99112 Other diseases of the blood and blood-forming organs and certain disorders involving the immune mechanism complicating pregnancy, second trimester: Secondary | ICD-10-CM

## 2019-10-30 DIAGNOSIS — Z3A27 27 weeks gestation of pregnancy: Secondary | ICD-10-CM

## 2019-10-30 DIAGNOSIS — O09523 Supervision of elderly multigravida, third trimester: Secondary | ICD-10-CM

## 2019-10-30 NOTE — Progress Notes (Signed)
   PRENATAL VISIT NOTE  Subjective:  Melanie Macias is a 39 y.o. [phone] at [redacted]w[redacted]d being seen today for ongoing prenatal care.  She is currently monitored for the following issues for this high-risk pregnancy and has H/O transfusion of packed red blood cells; Hx Anemia; Anal fissure; Thrombocytopenia, unspecified (HCC); Supervision of high risk pregnancy, antepartum; Monochorionic diamniotic twin gestation; and AMA (advanced maternal age) multigravida 35+ on their problem list.  Patient reports no complaints.  Contractions: Not present. Vag. Bleeding: None.  Movement: Present. Denies leaking of fluid.   The following portions of the patient's history were reviewed and updated as appropriate: allergies, current medications, past family history, past medical history, past social history, past surgical history and problem list.   Objective:   Vitals:   10/30/19 0834  BP: 103/69  Pulse: 87  Weight: 139 lb (63 kg)    Fetal Status: Fetal Heart Rate (bpm): 134/154   Movement: Present     General:  Alert, oriented and cooperative. Patient is in no acute distress.  Skin: Skin is warm and dry. No rash noted.   Cardiovascular: Normal heart rate noted  Respiratory: Normal respiratory effort, no problems with respiration noted  Abdomen: Soft, gravid, appropriate for gestational age.  Pain/Pressure: Absent     Pelvic: Cervical exam deferred        Extremities: Normal range of motion.  Edema: Trace  Mental Status: Normal mood and affect. Normal behavior. Normal judgment and thought content.   Assessment and Plan:  Pregnancy: G4P3003 at [redacted]w[redacted]d 1. Supervision of high risk pregnancy, antepartum FHT and FH normal 28 week labs today  2. Monochorionic diamniotic twin gestation in second trimester 6% discordance (EFW 67%/83%) [place] for growth in 2 weeks  3. Thrombocytopenia CBC today  4. Multigravida of advanced maternal age in third trimester On ASA 81mg  Serial growth  Preterm labor symptoms  and general obstetric precautions including but not limited to vaginal bleeding, contractions, leaking of fluid and fetal movement were reviewed in detail with the patient. Please refer to After Visit Summary for other counseling recommendations.   Return in about 15 days (around 11/14/2019) for [facility] OB f/u.  Future Appointments  Date Time Provider Department Center  10/30/2019  9:30 AM [facility]  11/14/2019  8:15 AM [facility] NURSE [facility]  11/14/2019  8:15 AM [facility] US2 [facility]  11/28/2019 11:30 AM [facility] NURSE [facility]  11/28/2019 11:30 AM [facility] US3 [facility]  12/05/2019  1:45 PM [facility] NURSE [facility]  12/05/2019  1:45 PM [facility] US4 [facility]  12/12/2019  1:45 PM [facility] NURSE [facility]  12/12/2019  1:45 PM [facility] US4 [facility]    12/14/2019, DO

## 2019-10-31 LAB — GLUCOSE TOLERANCE, 2 HOURS W/ 1HR
Glucose, 1 hour: 143 mg/dL (ref 65–179)
Glucose, 2 hour: 114 mg/dL (ref 65–152)
Glucose, Fasting: 76 mg/dL (ref 65–91)

## 2019-10-31 LAB — CBC
Hematocrit: 37.6 % (ref 34.0–46.6)
Hemoglobin: 12.6 g/dL (ref 11.1–15.9)
MCH: 31.8 pg (ref 26.6–33.0)
MCHC: 33.5 g/dL (ref 31.5–35.7)
MCV: 95 fL (ref 79–97)
Platelets: 149 10*3/uL — ABNORMAL LOW (ref 150–450)
RBC: 3.96 x10E6/uL (ref 3.77–5.28)
RDW: 13.7 % (ref 11.7–15.4)
WBC: 11 10*3/uL — ABNORMAL HIGH (ref 3.4–10.8)

## 2019-10-31 LAB — RPR: RPR Ser Ql: NONREACTIVE

## 2019-10-31 LAB — HIV ANTIBODY (ROUTINE TESTING W REFLEX): HIV Screen 4th Generation wRfx: NONREACTIVE

## 2019-11-14 ENCOUNTER — Ambulatory Visit: Payer: Medicaid Other | Admitting: *Deleted

## 2019-11-14 ENCOUNTER — Ambulatory Visit: Payer: Medicaid Other | Attending: Obstetrics and Gynecology

## 2019-11-14 ENCOUNTER — Ambulatory Visit (INDEPENDENT_AMBULATORY_CARE_PROVIDER_SITE_OTHER): Payer: Medicaid Other | Admitting: Medical

## 2019-11-14 ENCOUNTER — Encounter: Payer: Self-pay | Admitting: Medical

## 2019-11-14 ENCOUNTER — Other Ambulatory Visit: Payer: Self-pay

## 2019-11-14 VITALS — BP 118/70 | HR 85 | Wt 141.8 lb

## 2019-11-14 DIAGNOSIS — O99113 Other diseases of the blood and blood-forming organs and certain disorders involving the immune mechanism complicating pregnancy, third trimester: Secondary | ICD-10-CM

## 2019-11-14 DIAGNOSIS — O43193 Other malformation of placenta, third trimester: Secondary | ICD-10-CM

## 2019-11-14 DIAGNOSIS — O0990 Supervision of high risk pregnancy, unspecified, unspecified trimester: Secondary | ICD-10-CM

## 2019-11-14 DIAGNOSIS — O30033 Twin pregnancy, monochorionic/diamniotic, third trimester: Secondary | ICD-10-CM | POA: Insufficient documentation

## 2019-11-14 DIAGNOSIS — O30039 Twin pregnancy, monochorionic/diamniotic, unspecified trimester: Secondary | ICD-10-CM | POA: Diagnosis not present

## 2019-11-14 DIAGNOSIS — O0993 Supervision of high risk pregnancy, unspecified, third trimester: Secondary | ICD-10-CM

## 2019-11-14 DIAGNOSIS — O43123 Velamentous insertion of umbilical cord, third trimester: Secondary | ICD-10-CM

## 2019-11-14 DIAGNOSIS — D696 Thrombocytopenia, unspecified: Secondary | ICD-10-CM

## 2019-11-14 DIAGNOSIS — Z3A29 29 weeks gestation of pregnancy: Secondary | ICD-10-CM

## 2019-11-14 DIAGNOSIS — O09523 Supervision of elderly multigravida, third trimester: Secondary | ICD-10-CM | POA: Diagnosis present

## 2019-11-14 NOTE — Patient Instructions (Signed)
Fetal Movement Counts Patient Name: ________________________________________________ Patient Due Date: ____________________ What is a fetal movement count?  A fetal movement count is the number of times that you feel your baby move during a certain amount of time. This may also be called a fetal kick count. A fetal movement count is recommended for every pregnant woman. You may be asked to start counting fetal movements as early as week 28 of your pregnancy. Pay attention to when your baby is most active. You may notice your baby's sleep and wake cycles. You may also notice things that make your baby move more. You should do a fetal movement count:  When your baby is normally most active.  At the same time each day. A good time to count movements is while you are resting, after having something to eat and drink. How do I count fetal movements? 1. Find a quiet, comfortable area. Sit, or lie down on your side. 2. Write down the date, the start time and stop time, and the number of movements that you felt between those two times. Take this information with you to your health care visits. 3. Write down your start time when you feel the first movement. 4. Count kicks, flutters, swishes, rolls, and jabs. You should feel at least 10 movements. 5. You may stop counting after you have felt 10 movements, or if you have been counting for 2 hours. Write down the stop time. 6. If you do not feel 10 movements in 2 hours, contact your health care provider for further instructions. Your health care provider may want to do additional tests to assess your baby's well-being. Contact a health care provider if:  You feel fewer than 10 movements in 2 hours.  Your baby is not moving like he or she usually does. Date: ____________ Start time: ____________ Stop time: ____________ Movements: ____________ Date: ____________ Start time: ____________ Stop time: ____________ Movements: ____________ Date: ____________  Start time: ____________ Stop time: ____________ Movements: ____________ Date: ____________ Start time: ____________ Stop time: ____________ Movements: ____________ Date: ____________ Start time: ____________ Stop time: ____________ Movements: ____________ Date: ____________ Start time: ____________ Stop time: ____________ Movements: ____________ Date: ____________ Start time: ____________ Stop time: ____________ Movements: ____________ Date: ____________ Start time: ____________ Stop time: ____________ Movements: ____________ Date: ____________ Start time: ____________ Stop time: ____________ Movements: ____________ This information is not intended to replace advice given to you by your health care provider. Make sure you discuss any questions you have with your health care provider. Document Revised: 12/26/2018 Document Reviewed: 12/26/2018 Elsevier Patient Education  2020 [employer] Braxton Hicks Contractions Contractions of the uterus can occur throughout pregnancy, but they are not always a sign that you are in labor. You may have practice contractions called Braxton Hicks contractions. These false labor contractions are sometimes confused with true labor. What are Braxton Hicks contractions? Braxton Hicks contractions are tightening movements that occur in the muscles of the uterus before labor. Unlike true labor contractions, these contractions do not result in opening (dilation) and thinning of the cervix. Toward the end of pregnancy (32-34 weeks), Braxton Hicks contractions can happen more often and may become stronger. These contractions are sometimes difficult to tell apart from true labor because they can be very uncomfortable. You should not feel embarrassed if you go to the hospital with false labor. Sometimes, the only way to tell if you are in true labor is for your health care provider to look for changes in the cervix. The health care provider   will do a physical exam and may  monitor your contractions. If you are not in true labor, the exam should show that your cervix is not dilating and your water has not broken. If there are no other health problems associated with your pregnancy, it is completely safe for you to be sent home with false labor. You may continue to have Braxton Hicks contractions until you go into true labor. How to tell the difference between true labor and false labor True labor  Contractions last 30-70 seconds.  Contractions become very regular.  Discomfort is usually felt in the top of the uterus, and it spreads to the lower abdomen and low back.  Contractions do not go away with walking.  Contractions usually become more intense and increase in frequency.  The cervix dilates and gets thinner. False labor  Contractions are usually shorter and not as strong as true labor contractions.  Contractions are usually irregular.  Contractions are often felt in the front of the lower abdomen and in the groin.  Contractions may go away when you walk around or change positions while lying down.  Contractions get weaker and are shorter-lasting as time goes on.  The cervix usually does not dilate or become thin. Follow these instructions at home:   Take over-the-counter and prescription medicines only as told by your health care provider.  Keep up with your usual exercises and follow other instructions from your health care provider.  Eat and drink lightly if you think you are going into labor.  If Braxton Hicks contractions are making you uncomfortable: ? Change your position from lying down or resting to walking, or change from walking to resting. ? Sit and rest in a tub of warm water. ? Drink enough fluid to keep your urine pale yellow. Dehydration may cause these contractions. ? Do slow and deep breathing several times an hour.  Keep all follow-up prenatal visits as told by your health care provider. This is important. Contact a  health care provider if:  You have a fever.  You have continuous pain in your abdomen. Get help right away if:  Your contractions become stronger, more regular, and closer together.  You have fluid leaking or gushing from your vagina.  You pass blood-tinged mucus (bloody show).  You have bleeding from your vagina.  You have low back pain that you never had before.  You feel your baby's head pushing down and causing pelvic pressure.  Your baby is not moving inside you as much as it used to. Summary  Contractions that occur before labor are called Braxton Hicks contractions, false labor, or practice contractions.  Braxton Hicks contractions are usually shorter, weaker, farther apart, and less regular than true labor contractions. True labor contractions usually become progressively stronger and regular, and they become more frequent.  Manage discomfort from Braxton Hicks contractions by changing position, resting in a warm bath, drinking plenty of water, or practicing deep breathing. This information is not intended to replace advice given to you by your health care provider. Make sure you discuss any questions you have with your health care provider. Document Revised: 04/20/2017 Document Reviewed: 09/21/2016 Elsevier Patient Education  2020 Elsevier Inc.  

## 2019-11-14 NOTE — Progress Notes (Signed)
   PRENATAL VISIT NOTE  Subjective:  Melanie Macias is a 39 y.o. [phone] at [redacted]w[redacted]d being seen today for ongoing prenatal care.  She is currently monitored for the following issues for this high-risk pregnancy and has H/O transfusion of packed red blood cells; Hx Anemia; Anal fissure; Thrombocytopenia, unspecified (HCC); Supervision of high risk pregnancy, antepartum; Monochorionic diamniotic twin gestation; and AMA (advanced maternal age) multigravida 35+ on their problem list.  Patient reports no complaints.  Contractions: Irritability. Vag. Bleeding: None.  Movement: Present. Denies leaking of fluid.   The following portions of the patient's history were reviewed and updated as appropriate: allergies, current medications, past family history, past medical history, past social history, past surgical history and problem list.   Objective:   Vitals:   11/14/19 1015  BP: 118/70  Pulse: 85  Weight: 141 lb 12.8 oz (64.3 kg)    Fetal Status: Fetal Heart Rate (bpm): 129/131   Movement: Present     General:  Alert, oriented and cooperative. Patient is in no acute distress.  Skin: Skin is warm and dry. No rash noted.   Cardiovascular: Normal heart rate noted  Respiratory: Normal respiratory effort, no problems with respiration noted  Abdomen: Soft, gravid, appropriate for gestational age.  Pain/Pressure: Present     Pelvic: Cervical exam deferred        Extremities: Normal range of motion.  Edema: Trace  Mental Status: Normal mood and affect. Normal behavior. Normal judgment and thought content.   Assessment and Plan:  Pregnancy: G4P3003 at [redacted]w[redacted]d 1. Supervision of high risk pregnancy, antepartum - Discussed normal 2 hour GTT results, normal @ last visit   2. Monochorionic diamniotic twin gestation in third trimester - [place] today, normal  - Follow-up growth scheduled 7/9 and 7/16 - Weekly BPPs starting at 32 weeks planned  - Delivery at 37 weeks  - Discussed options for MOD and pain  management   3. Multigravida of advanced maternal age in third trimester  4. Thrombocytopenia, unspecified (HCC)  Preterm labor symptoms and general obstetric precautions including but not limited to vaginal bleeding, contractions, leaking of fluid and fetal movement were reviewed in detail with the patient. Please refer to After Visit Summary for other counseling recommendations.   Return in about 2 weeks (around 11/28/2019) for [facility] MD only, In-Person.  Future Appointments  Date Time Provider Department Center  11/28/2019 11:30 AM [facility] NURSE [facility]  11/28/2019 11:30 AM [facility] US3 [facility]  12/01/2019  3:15 PM Noralee Chars [facility]  12/05/2019  1:45 PM [facility] NURSE [facility]  12/05/2019  1:45 PM [facility] US4 [facility]  12/12/2019 10:55 AM Kathlene Cote [facility]  12/12/2019  1:45 PM [facility] NURSE [facility]  12/12/2019  1:45 PM [facility] US4 [facility]  12/26/2019 10:55 AM Adam Phenix, MD [facility]    Vonzella Nipple, PA-C

## 2019-11-28 ENCOUNTER — Ambulatory Visit: Payer: Medicaid Other | Attending: Obstetrics and Gynecology

## 2019-11-28 ENCOUNTER — Other Ambulatory Visit: Payer: Self-pay

## 2019-11-28 ENCOUNTER — Ambulatory Visit: Payer: Medicaid Other | Admitting: *Deleted

## 2019-11-28 DIAGNOSIS — O43123 Velamentous insertion of umbilical cord, third trimester: Secondary | ICD-10-CM | POA: Diagnosis not present

## 2019-11-28 DIAGNOSIS — O30039 Twin pregnancy, monochorionic/diamniotic, unspecified trimester: Secondary | ICD-10-CM

## 2019-11-28 DIAGNOSIS — O30033 Twin pregnancy, monochorionic/diamniotic, third trimester: Secondary | ICD-10-CM

## 2019-11-28 DIAGNOSIS — O0990 Supervision of high risk pregnancy, unspecified, unspecified trimester: Secondary | ICD-10-CM | POA: Diagnosis present

## 2019-11-28 DIAGNOSIS — O43193 Other malformation of placenta, third trimester: Secondary | ICD-10-CM | POA: Diagnosis not present

## 2019-11-28 DIAGNOSIS — O09523 Supervision of elderly multigravida, third trimester: Secondary | ICD-10-CM | POA: Diagnosis not present

## 2019-11-28 DIAGNOSIS — Z3A31 31 weeks gestation of pregnancy: Secondary | ICD-10-CM

## 2019-12-01 ENCOUNTER — Ambulatory Visit (INDEPENDENT_AMBULATORY_CARE_PROVIDER_SITE_OTHER): Payer: Medicaid Other | Admitting: Family Medicine

## 2019-12-01 ENCOUNTER — Other Ambulatory Visit: Payer: Self-pay

## 2019-12-01 VITALS — BP 110/73 | HR 96 | Wt 147.4 lb

## 2019-12-01 DIAGNOSIS — Z3A32 32 weeks gestation of pregnancy: Secondary | ICD-10-CM

## 2019-12-01 DIAGNOSIS — O0990 Supervision of high risk pregnancy, unspecified, unspecified trimester: Secondary | ICD-10-CM

## 2019-12-01 DIAGNOSIS — O09523 Supervision of elderly multigravida, third trimester: Secondary | ICD-10-CM

## 2019-12-01 DIAGNOSIS — O0993 Supervision of high risk pregnancy, unspecified, third trimester: Secondary | ICD-10-CM

## 2019-12-01 DIAGNOSIS — O30033 Twin pregnancy, monochorionic/diamniotic, third trimester: Secondary | ICD-10-CM

## 2019-12-01 NOTE — Progress Notes (Signed)
   PRENATAL VISIT NOTE  Subjective:  Melanie Macias is a 39 y.o. [phone] at [redacted]w[redacted]d being seen today for ongoing prenatal care.  She is currently monitored for the following issues for this high-risk pregnancy and has H/O transfusion of packed red blood cells; Hx Anemia; Anal fissure; Thrombocytopenia, unspecified (HCC); Supervision of high risk pregnancy, antepartum; Monochorionic diamniotic twin gestation; and AMA (advanced maternal age) multigravida 35+ on their problem list.  Patient reports congestion. Has cold - husband and children had it earlier last week.No increase in SOB.  Contractions: Irritability. Vag. Bleeding: None.  Movement: Present. Denies leaking of fluid.   The following portions of the patient's history were reviewed and updated as appropriate: allergies, current medications, past family history, past medical history, past social history, past surgical history and problem list.   Objective:   Vitals:   12/01/19 1529  BP: 110/73  Pulse: 96  Weight: 147 lb 6.4 oz (66.9 kg)    Fetal Status: Fetal Heart Rate (bpm): 122/128   Movement: Present     General:  Alert, oriented and cooperative. Patient is in no acute distress.  Skin: Skin is warm and dry. No rash noted.   Cardiovascular: Normal heart rate noted  Respiratory: Normal respiratory effort, no problems with respiration noted  Abdomen: Soft, gravid, appropriate for gestational age.  Pain/Pressure: Present     Pelvic: Cervical exam deferred        Extremities: Normal range of motion.  Edema: Trace  Mental Status: Normal mood and affect. Normal behavior. Normal judgment and thought content.   Assessment and Plan:  Pregnancy: G4P3003 at [redacted]w[redacted]d 1. Supervision of high risk pregnancy, antepartum FHT normal  2. Monochorionic diamniotic twin gestation in third trimester Stable growth: 4% discordance. 90%/83% Weekly BPP starting this week  3. Multigravida of advanced maternal age in third trimester   Preterm labor  symptoms and general obstetric precautions including but not limited to vaginal bleeding, contractions, leaking of fluid and fetal movement were reviewed in detail with the patient. Please refer to After Visit Summary for other counseling recommendations.   Return in about 2 weeks (around 12/15/2019).  Future Appointments  Date Time Provider Department Center  12/05/2019  1:45 PM [facility] NURSE [facility]  12/05/2019  1:45 PM [facility] US4 [facility]  12/12/2019 10:55 AM Rennie Plowman [facility]  12/12/2019  1:45 PM [facility] NURSE [facility]  12/12/2019  1:45 PM [facility] US4 [facility]  12/26/2019 10:55 AM Adam Phenix, MD [facility]    Levie Heritage, DO

## 2019-12-05 ENCOUNTER — Other Ambulatory Visit: Payer: Self-pay

## 2019-12-05 ENCOUNTER — Ambulatory Visit: Payer: Medicaid Other | Admitting: *Deleted

## 2019-12-05 ENCOUNTER — Other Ambulatory Visit: Payer: Self-pay | Admitting: Obstetrics and Gynecology

## 2019-12-05 ENCOUNTER — Ambulatory Visit: Payer: Medicaid Other | Attending: Obstetrics and Gynecology

## 2019-12-05 DIAGNOSIS — O43123 Velamentous insertion of umbilical cord, third trimester: Secondary | ICD-10-CM | POA: Diagnosis not present

## 2019-12-05 DIAGNOSIS — O0990 Supervision of high risk pregnancy, unspecified, unspecified trimester: Secondary | ICD-10-CM | POA: Insufficient documentation

## 2019-12-05 DIAGNOSIS — O30039 Twin pregnancy, monochorionic/diamniotic, unspecified trimester: Secondary | ICD-10-CM

## 2019-12-05 DIAGNOSIS — Z3A32 32 weeks gestation of pregnancy: Secondary | ICD-10-CM

## 2019-12-05 DIAGNOSIS — O30033 Twin pregnancy, monochorionic/diamniotic, third trimester: Secondary | ICD-10-CM

## 2019-12-05 DIAGNOSIS — O09523 Supervision of elderly multigravida, third trimester: Secondary | ICD-10-CM | POA: Diagnosis not present

## 2019-12-05 DIAGNOSIS — O43193 Other malformation of placenta, third trimester: Secondary | ICD-10-CM | POA: Diagnosis not present

## 2019-12-12 ENCOUNTER — Ambulatory Visit: Payer: Medicaid Other | Attending: Obstetrics and Gynecology

## 2019-12-12 ENCOUNTER — Ambulatory Visit: Payer: Medicaid Other | Admitting: *Deleted

## 2019-12-12 ENCOUNTER — Other Ambulatory Visit: Payer: Self-pay | Admitting: Obstetrics and Gynecology

## 2019-12-12 ENCOUNTER — Other Ambulatory Visit: Payer: Self-pay | Admitting: *Deleted

## 2019-12-12 ENCOUNTER — Other Ambulatory Visit: Payer: Self-pay

## 2019-12-12 ENCOUNTER — Ambulatory Visit (INDEPENDENT_AMBULATORY_CARE_PROVIDER_SITE_OTHER): Payer: Medicaid Other

## 2019-12-12 VITALS — BP 126/86 | HR 89 | Wt 149.8 lb

## 2019-12-12 DIAGNOSIS — O0990 Supervision of high risk pregnancy, unspecified, unspecified trimester: Secondary | ICD-10-CM

## 2019-12-12 DIAGNOSIS — O43123 Velamentous insertion of umbilical cord, third trimester: Secondary | ICD-10-CM | POA: Diagnosis not present

## 2019-12-12 DIAGNOSIS — O09523 Supervision of elderly multigravida, third trimester: Secondary | ICD-10-CM | POA: Insufficient documentation

## 2019-12-12 DIAGNOSIS — Z3A33 33 weeks gestation of pregnancy: Secondary | ICD-10-CM

## 2019-12-12 DIAGNOSIS — O30033 Twin pregnancy, monochorionic/diamniotic, third trimester: Secondary | ICD-10-CM | POA: Insufficient documentation

## 2019-12-12 DIAGNOSIS — O30039 Twin pregnancy, monochorionic/diamniotic, unspecified trimester: Secondary | ICD-10-CM | POA: Diagnosis not present

## 2019-12-12 DIAGNOSIS — O43193 Other malformation of placenta, third trimester: Secondary | ICD-10-CM

## 2019-12-12 DIAGNOSIS — O09529 Supervision of elderly multigravida, unspecified trimester: Secondary | ICD-10-CM

## 2019-12-12 DIAGNOSIS — D696 Thrombocytopenia, unspecified: Secondary | ICD-10-CM

## 2019-12-12 NOTE — Patient Instructions (Signed)

## 2019-12-12 NOTE — Progress Notes (Signed)
   PRENATAL VISIT NOTE  Subjective:  Melanie Macias is a 39 y.o. [phone] at [redacted]w[redacted]d being seen today for ongoing prenatal care.  She is currently monitored for the following issues for this high-risk pregnancy and has H/O transfusion of packed red blood cells; Hx Anemia; Anal fissure; Thrombocytopenia, unspecified (HCC); Supervision of high risk pregnancy, antepartum; Monochorionic diamniotic twin gestation; and AMA (advanced maternal age) multigravida 35+ on their problem list.  Patient reports no complaints.  Contractions: Irritability. Vag. Bleeding: None.  Movement: Present. Denies leaking of fluid.   The following portions of the patient's history were reviewed and updated as appropriate: allergies, current medications, past family history, past medical history, past social history, past surgical history and problem list.   Objective:   Vitals:   12/12/19 1112  BP: (!) 126/86  Pulse: 89  Weight: 149 lb 12.8 oz (67.9 kg)    Fetal Status: Fetal Heart Rate (bpm): 124/147   Movement: Present     General:  Alert, oriented and cooperative. Patient is in no acute distress.  Skin: Skin is warm and dry. No rash noted.   Cardiovascular: Normal heart rate noted  Respiratory: Normal respiratory effort, no problems with respiration noted  Abdomen: Soft, gravid, appropriate for gestational age.  Pain/Pressure: Present     Pelvic: Cervical exam deferred        Extremities: Normal range of motion.  Edema: Trace  Mental Status: Normal mood and affect. Normal behavior. Normal judgment and thought content.   Assessment and Plan:  Pregnancy: G4P3003 at [redacted]w[redacted]d 1. Supervision of high risk pregnancy, antepartum -No complaints. Routine care -Reviewed delivery and pain management options  2. Monochorionic diamniotic twin gestation in third trimester -Weekly BPPs, scheduled at 1345 today. MFM recommending delivery around 37w  3. Multigravida of advanced maternal age in third trimester   4.  Thrombocytopenia, unspecified (HCC) -Platelets 149 at last check  Preterm labor symptoms and general obstetric precautions including but not limited to vaginal bleeding, contractions, leaking of fluid and fetal movement were reviewed in detail with the patient. Please refer to After Visit Summary for other counseling recommendations.   Return in about 2 weeks (around 12/26/2019) for Return OB visit.  Future Appointments  Date Time Provider Department Center  12/12/2019  1:45 PM [facility] NURSE [facility]  12/12/2019  1:45 PM [facility] US4 [facility]  12/26/2019 10:55 AM Adam Phenix, MD [facility]    Rolm Bookbinder, CNM  12/12/19 11:36 AM

## 2019-12-19 ENCOUNTER — Ambulatory Visit: Payer: Medicaid Other | Admitting: *Deleted

## 2019-12-19 ENCOUNTER — Other Ambulatory Visit: Payer: Self-pay

## 2019-12-19 ENCOUNTER — Ambulatory Visit: Payer: Medicaid Other | Attending: Obstetrics and Gynecology

## 2019-12-19 ENCOUNTER — Encounter: Payer: Self-pay | Admitting: *Deleted

## 2019-12-19 DIAGNOSIS — O0990 Supervision of high risk pregnancy, unspecified, unspecified trimester: Secondary | ICD-10-CM | POA: Insufficient documentation

## 2019-12-19 DIAGNOSIS — Z3A34 34 weeks gestation of pregnancy: Secondary | ICD-10-CM

## 2019-12-19 DIAGNOSIS — O43123 Velamentous insertion of umbilical cord, third trimester: Secondary | ICD-10-CM | POA: Diagnosis not present

## 2019-12-19 DIAGNOSIS — O30033 Twin pregnancy, monochorionic/diamniotic, third trimester: Secondary | ICD-10-CM

## 2019-12-19 DIAGNOSIS — O43193 Other malformation of placenta, third trimester: Secondary | ICD-10-CM

## 2019-12-19 DIAGNOSIS — O09523 Supervision of elderly multigravida, third trimester: Secondary | ICD-10-CM | POA: Diagnosis not present

## 2019-12-19 DIAGNOSIS — O09529 Supervision of elderly multigravida, unspecified trimester: Secondary | ICD-10-CM | POA: Diagnosis not present

## 2019-12-26 ENCOUNTER — Other Ambulatory Visit: Payer: Self-pay

## 2019-12-26 ENCOUNTER — Ambulatory Visit (HOSPITAL_BASED_OUTPATIENT_CLINIC_OR_DEPARTMENT_OTHER): Payer: Medicaid Other

## 2019-12-26 ENCOUNTER — Other Ambulatory Visit: Payer: Self-pay | Admitting: Obstetrics

## 2019-12-26 ENCOUNTER — Ambulatory Visit: Payer: Medicaid Other | Admitting: *Deleted

## 2019-12-26 ENCOUNTER — Other Ambulatory Visit (HOSPITAL_COMMUNITY)
Admission: RE | Admit: 2019-12-26 | Discharge: 2019-12-26 | Disposition: A | Discharge status: Home / Self Care | Payer: Medicaid Other | Source: Ambulatory Visit | Attending: Obstetrics & Gynecology | Admitting: Obstetrics & Gynecology

## 2019-12-26 ENCOUNTER — Ambulatory Visit (INDEPENDENT_AMBULATORY_CARE_PROVIDER_SITE_OTHER): Payer: Medicaid Other | Admitting: Obstetrics & Gynecology

## 2019-12-26 ENCOUNTER — Encounter: Payer: Self-pay | Admitting: *Deleted

## 2019-12-26 VITALS — BP 116/77 | HR 92 | Wt 148.5 lb

## 2019-12-26 DIAGNOSIS — O09523 Supervision of elderly multigravida, third trimester: Secondary | ICD-10-CM

## 2019-12-26 DIAGNOSIS — O09529 Supervision of elderly multigravida, unspecified trimester: Secondary | ICD-10-CM

## 2019-12-26 DIAGNOSIS — O43123 Velamentous insertion of umbilical cord, third trimester: Secondary | ICD-10-CM | POA: Diagnosis not present

## 2019-12-26 DIAGNOSIS — O321XX0 Maternal care for breech presentation, not applicable or unspecified: Secondary | ICD-10-CM

## 2019-12-26 DIAGNOSIS — O43193 Other malformation of placenta, third trimester: Secondary | ICD-10-CM | POA: Diagnosis not present

## 2019-12-26 DIAGNOSIS — O30033 Twin pregnancy, monochorionic/diamniotic, third trimester: Secondary | ICD-10-CM

## 2019-12-26 DIAGNOSIS — O0990 Supervision of high risk pregnancy, unspecified, unspecified trimester: Secondary | ICD-10-CM | POA: Insufficient documentation

## 2019-12-26 DIAGNOSIS — O30039 Twin pregnancy, monochorionic/diamniotic, unspecified trimester: Secondary | ICD-10-CM | POA: Insufficient documentation

## 2019-12-26 DIAGNOSIS — Z3A35 35 weeks gestation of pregnancy: Secondary | ICD-10-CM

## 2019-12-26 NOTE — Patient Instructions (Signed)
National Guideline Alliance ([place]).Twin and Triplet Pregnancy. [place]: National Institute for Health and Care Excellence (UK); 2019.">  Multiple Pregnancy Multiple pregnancy means that a woman is carrying more than one baby at a time. She may be pregnant with twins, triplets, or more. The majority of multiple pregnancies are twins. Naturally conceiving triplets or more (higher-order multiples) is rare. Multiple pregnancies are riskier than single pregnancies. A woman with a multiple pregnancy is more likely to have certain problems during her pregnancy. How does a multiple pregnancy happen? A multiple pregnancy happens when:  The woman's body releases more than one egg at a time, and then each egg gets fertilized by a different sperm. ? This is the most common type of multiple pregnancy. ? Twins or other multiples produced this way are called fraternal. They are no more alike than non-multiple siblings are.  One sperm fertilizes one egg, which then divides into more than one embryo. ? Twins or other multiples produced this way are called identical. Identical multiples are always the same gender, and they look very much alike. Who is most likely to have a multiple pregnancy? A multiple pregnancy is more likely to develop in women who:  Have had fertility treatment, especially if the treatment included fertility medicines.  Are older than 41 years of age.  Have already had four or more children.  Have a family history of multiple pregnancy. How is a multiple pregnancy diagnosed? A multiple pregnancy may be diagnosed based on:  Symptoms such as: ? Rapid weight gain in the first 3 months of pregnancy (first trimester). ? More severe nausea and breast tenderness than what is typical of a single pregnancy. ? A larger uterus than what is normal for the stage of the pregnancy.  Blood tests that detect a higher-than-normal level of human chorionic gonadotropin (hCG). This is a hormone that your  body produces in early pregnancy.  An ultrasound exam. This is used to confirm that you are carrying multiples. What risks come with multiple pregnancy? A multiple pregnancy puts you at a higher risk for certain problems during or after your pregnancy. These include:  Delivering your babies before your due date (preterm birth). A full-term pregnancy lasts for at least 37 weeks. ? Babies born before 37 weeks may have a higher risk for breathing problems, feeding difficulties, cerebral palsy, and learning disabilities.  Diabetes.  Preeclampsia. This is a serious condition that causes high blood pressure and headaches during pregnancy.  Too much blood loss after childbirth (postpartum hemorrhage).  Postpartum depression.  Low birth weight of the babies. How will having a multiple pregnancy affect my care? Your health care team will monitor you more closely. You may need more frequent prenatal visits. This will ensure that you are healthy and that your babies are growing normally. Follow these instructions at home: Eating and drinking  Increase your nutrition. ? Follow your health care provider's recommendations for weight gain. You may need to gain a little extra weight when you are pregnant with multiples. ? Eat healthy snacks often throughout the day. This will add calories and reduce nausea.  Drink enough fluid to keep your urine pale yellow.  Take prenatal vitamins. Ask your health care provider what vitamins are right for you. Activity Limit your activities by 20-24 weeks of pregnancy.  Rest often.  Avoid activities, exercise, and work that take a lot of effort.  Ask your health care provider when you should stop having sex. General instructions  Do not use any   products that contain nicotine or tobacco, such as cigarettes, e-cigarettes, and chewing tobacco. If you need help quitting, ask your health care provider.  Do not drink alcohol or use illegal drugs.  Take  over-the-counter and prescription medicines only as told by your health care provider.  Arrange for extra help around the house.  Keep all follow-up visits and all prenatal visits as told by your health care provider. This is important. Where to find more information  American College of Obstetricians and Gynecology: www.acog.org Contact a health care provider if:  You have dizziness.  You have nausea, vomiting, or diarrhea that does not go away.  You have depression or other emotions that are interfering with your normal activities.  You have a fever.  You have pain with urination.  You have a bad-smelling vaginal discharge.  You notice increased swelling in your face, hands, legs, or ankles. Get help right away if:  You have fluid leaking from your vagina.  You have bleeding from your vagina.  You have pelvic cramps, pelvic pressure, or nagging pain in your abdomen or lower back.  You are having regular contractions.  You have a severe headache, with or without changes in how you see.  You have chest pain or shortness of breath.  You notice that your babies move less often, or do not move at all. Summary  Having a multiple pregnancy means that a woman is carrying more than one baby at a time.  A multiple pregnancy puts you at a higher risk for delivering your babies before your due date, having diabetes, preeclampsia, too much blood loss after childbirth, or low birth weight of the babies.  Your health care provider will monitor you more closely during your pregnancy.  You may need to make some lifestyle changes during pregnancy. This includes eating more, limiting your activities after 20-24 weeks of pregnancy, and arranging for extra help around the house.  Follow up with your health care provider as instructed if you experience any complications. This information is not intended to replace advice given to you by your health care provider. Make sure you discuss  any questions you have with your health care provider. Document Revised: 12/30/2018 Document Reviewed: 12/30/2018 Elsevier Patient Education  2020 Elsevier Inc.  

## 2019-12-26 NOTE — Progress Notes (Signed)
C/o numbness/ tingling in right arm. C/o streaks bright red Sunday, none since then.  Viktorya Arguijo,RN

## 2019-12-26 NOTE — Progress Notes (Signed)
° °  PRENATAL VISIT NOTE  Subjective:  Melanie Macias is a 39 y.o. [phone] at [redacted]w[redacted]d being seen today for ongoing prenatal care.  She is currently monitored for the following issues for this high-risk pregnancy and has H/O transfusion of packed red blood cells; Hx Anemia; Anal fissure; Thrombocytopenia, unspecified (HCC); Supervision of high risk pregnancy, antepartum; Monochorionic diamniotic twin gestation; and AMA (advanced maternal age) multigravida 35+ on their problem list.  Patient reports occasional contractions.  Contractions: Irregular. Vag. Bleeding: None.  Movement: Present. Denies leaking of fluid.   The following portions of the patient's history were reviewed and updated as appropriate: allergies, current medications, past family history, past medical history, past social history, past surgical history and problem list.   Objective:   Vitals:   12/26/19 1122  BP: 116/77  Pulse: 92  Weight: 148 lb 8 oz (67.4 kg)    Fetal Status: Fetal Heart Rate (bpm): 148//132   Movement: Present  Presentation: Vertex  General:  Alert, oriented and cooperative. Patient is in no acute distress.  Skin: Skin is warm and dry. No rash noted.   Cardiovascular: Normal heart rate noted  Respiratory: Normal respiratory effort, no problems with respiration noted  Abdomen: Soft, gravid, appropriate for gestational age.  Pain/Pressure: Present     Pelvic: Cervical exam performed in the presence of a chaperone Dilation: 4 Effacement (%): 70 Station: -3  Extremities: Normal range of motion.  Edema: Trace  Mental Status: Normal mood and affect. Normal behavior. Normal judgment and thought content.   Assessment and Plan:  Pregnancy: G4P3003 at [redacted]w[redacted]d 1. Monochorionic diamniotic twin pregnancy, antepartum Routine testing - GC/Chlamydia probe amp ([place])not at [facility] - Culture, beta strep (group b only)  2. Supervision of high risk pregnancy, antepartum  - GC/Chlamydia probe amp ([place])not  at [facility] - Culture, beta strep (group b only)  3. Multigravida of advanced maternal age in third trimester [place] for growth today  Preterm labor symptoms and general obstetric precautions including but not limited to vaginal bleeding, contractions, leaking of fluid and fetal movement were reviewed in detail with the patient. Please refer to After Visit Summary for other counseling recommendations.   Return in about 1 week (around 01/02/2020).  Future Appointments  Date Time Provider Department Center  12/26/2019 12:45 PM [facility] NURSE [facility]  12/26/2019  1:00 PM [facility] US1 [facility]  01/01/2020  1:00 PM Barbaraann Boys [facility]  01/02/2020 12:30 PM [facility] NURSE [facility]  01/02/2020 12:45 PM [facility] US4 [facility]    Scheryl Darter, MD

## 2019-12-27 ENCOUNTER — Other Ambulatory Visit: Payer: Self-pay

## 2019-12-27 ENCOUNTER — Inpatient Hospital Stay (HOSPITAL_COMMUNITY): Payer: Medicaid Other | Admitting: Anesthesiology

## 2019-12-27 ENCOUNTER — Encounter (HOSPITAL_COMMUNITY): Payer: Self-pay | Admitting: Obstetrics and Gynecology

## 2019-12-27 ENCOUNTER — Inpatient Hospital Stay (HOSPITAL_COMMUNITY)
Admission: AD | Admit: 2019-12-27 | Discharge: 2019-12-29 | DRG: 768 | Disposition: A | Discharge status: Home / Self Care | Payer: Medicaid Other | Attending: Obstetrics and Gynecology | Admitting: Obstetrics and Gynecology

## 2019-12-27 DIAGNOSIS — Z23 Encounter for immunization: Secondary | ICD-10-CM

## 2019-12-27 DIAGNOSIS — Z20822 Contact with and (suspected) exposure to covid-19: Secondary | ICD-10-CM | POA: Diagnosis present

## 2019-12-27 DIAGNOSIS — O328XX2 Maternal care for other malpresentation of fetus, fetus 2: Secondary | ICD-10-CM | POA: Diagnosis present

## 2019-12-27 DIAGNOSIS — O42919 Preterm premature rupture of membranes, unspecified as to length of time between rupture and onset of labor, unspecified trimester: Secondary | ICD-10-CM | POA: Diagnosis present

## 2019-12-27 DIAGNOSIS — D696 Thrombocytopenia, unspecified: Secondary | ICD-10-CM | POA: Diagnosis present

## 2019-12-27 DIAGNOSIS — O09523 Supervision of elderly multigravida, third trimester: Secondary | ICD-10-CM

## 2019-12-27 DIAGNOSIS — O42013 Preterm premature rupture of membranes, onset of labor within 24 hours of rupture, third trimester: Secondary | ICD-10-CM

## 2019-12-27 DIAGNOSIS — O0990 Supervision of high risk pregnancy, unspecified, unspecified trimester: Secondary | ICD-10-CM

## 2019-12-27 DIAGNOSIS — O30039 Twin pregnancy, monochorionic/diamniotic, unspecified trimester: Secondary | ICD-10-CM | POA: Diagnosis present

## 2019-12-27 DIAGNOSIS — O42913 Preterm premature rupture of membranes, unspecified as to length of time between rupture and onset of labor, third trimester: Principal | ICD-10-CM | POA: Diagnosis present

## 2019-12-27 DIAGNOSIS — O30033 Twin pregnancy, monochorionic/diamniotic, third trimester: Secondary | ICD-10-CM | POA: Diagnosis present

## 2019-12-27 DIAGNOSIS — O9912 Other diseases of the blood and blood-forming organs and certain disorders involving the immune mechanism complicating childbirth: Secondary | ICD-10-CM | POA: Diagnosis present

## 2019-12-27 DIAGNOSIS — O09529 Supervision of elderly multigravida, unspecified trimester: Secondary | ICD-10-CM

## 2019-12-27 DIAGNOSIS — Z3A36 36 weeks gestation of pregnancy: Secondary | ICD-10-CM | POA: Diagnosis not present

## 2019-12-27 LAB — PREPARE RBC (CROSSMATCH)

## 2019-12-27 LAB — CBC
HCT: 40.4 % (ref 36.0–46.0)
HCT: 27.5 % — ABNORMAL LOW (ref 36.0–46.0)
Hemoglobin: 13.6 g/dL (ref 12.0–15.0)
Hemoglobin: 9.2 g/dL — ABNORMAL LOW (ref 12.0–15.0)
MCH: 31.5 pg (ref 26.0–34.0)
MCH: 31.3 pg (ref 26.0–34.0)
MCHC: 33.7 g/dL (ref 30.0–36.0)
MCHC: 33.5 g/dL (ref 30.0–36.0)
MCV: 93.5 fL (ref 80.0–100.0)
MCV: 93.5 fL (ref 80.0–100.0)
Platelets: 205 10*3/uL (ref 150–400)
Platelets: 188 10*3/uL (ref 150–400)
RBC: 4.32 MIL/uL (ref 3.87–5.11)
RBC: 2.94 MIL/uL — ABNORMAL LOW (ref 3.87–5.11)
RDW: 12.5 % (ref 11.5–15.5)
RDW: 12.5 % (ref 11.5–15.5)
WBC: 11 10*3/uL — ABNORMAL HIGH (ref 4.0–10.5)
WBC: 16.9 10*3/uL — ABNORMAL HIGH (ref 4.0–10.5)
nRBC: 0 % (ref 0.0–0.2)
nRBC: 0 % (ref 0.0–0.2)

## 2019-12-27 LAB — ABO/RH: ABO/RH(D): O POS

## 2019-12-27 LAB — RPR: RPR Ser Ql: NONREACTIVE

## 2019-12-27 LAB — SARS CORONAVIRUS 2 BY RT PCR (HOSPITAL ORDER, PERFORMED IN ~~LOC~~ HOSPITAL LAB): SARS Coronavirus 2: NEGATIVE

## 2019-12-27 LAB — POCT FERN TEST: POCT Fern Test: POSITIVE

## 2019-12-27 MED ORDER — SODIUM CHLORIDE (PF) 0.9 % IJ SOLN
INTRAMUSCULAR | Status: DC | PRN
  Administered 2019-12-27: 12 mL/h via EPIDURAL

## 2019-12-27 MED ORDER — FENTANYL-BUPIVACAINE-NACL 0.5-0.125-0.9 MG/250ML-% EP SOLN: 12.0000 mL/h | EPIDURAL | Status: DC | PRN

## 2019-12-27 MED ORDER — SODIUM CHLORIDE 0.9% IV SOLUTION: Freq: Once | INTRAVENOUS | Status: DC

## 2019-12-27 MED ORDER — ZOLPIDEM TARTRATE 5 MG PO TABS: 5.0000 mg | ORAL_TABLET | Freq: Every evening | ORAL | Status: DC | PRN

## 2019-12-27 MED ORDER — TETANUS-DIPHTH-ACELL PERTUSSIS 5-2.5-18.5 LF-MCG/0.5 IM SUSP: 0.5000 mL | Freq: Once | INTRAMUSCULAR | Status: DC

## 2019-12-27 MED ORDER — WITCH HAZEL-GLYCERIN EX PADS: 1.0000 "application " | MEDICATED_PAD | CUTANEOUS | Status: DC | PRN

## 2019-12-27 MED ORDER — ACETAMINOPHEN 325 MG PO TABS: 650.0000 mg | ORAL_TABLET | ORAL | Status: DC | PRN

## 2019-12-27 MED ORDER — TRANEXAMIC ACID-NACL 1000-0.7 MG/100ML-% IV SOLN
INTRAVENOUS | Status: AC
  Filled 2019-12-27: qty 100

## 2019-12-27 MED ORDER — CEFAZOLIN SODIUM-DEXTROSE 2-4 GM/100ML-% IV SOLN
2.0000 g | Freq: Once | INTRAVENOUS | Status: AC
  Administered 2019-12-27: 2 g via INTRAVENOUS
  Filled 2019-12-27: qty 100

## 2019-12-27 MED ORDER — FUROSEMIDE 20 MG PO TABS
20.0000 mg | ORAL_TABLET | Freq: Once | ORAL | Status: AC
  Administered 2019-12-27: 20 mg via ORAL
  Filled 2019-12-27: qty 1

## 2019-12-27 MED ORDER — OXYCODONE-ACETAMINOPHEN 5-325 MG PO TABS: 1.0000 | ORAL_TABLET | ORAL | Status: DC | PRN

## 2019-12-27 MED ORDER — ONDANSETRON HCL 4 MG PO TABS: 4.0000 mg | ORAL_TABLET | ORAL | Status: DC | PRN

## 2019-12-27 MED ORDER — LIDOCAINE HCL (PF) 1 % IJ SOLN: 30.0000 mL | INTRAMUSCULAR | Status: DC | PRN

## 2019-12-27 MED ORDER — EPHEDRINE 5 MG/ML INJ: 10.0000 mg | INTRAVENOUS | Status: DC | PRN

## 2019-12-27 MED ORDER — LACTATED RINGERS IV SOLN: INTRAVENOUS | Status: DC

## 2019-12-27 MED ORDER — TERBUTALINE SULFATE 1 MG/ML IJ SOLN: 0.2500 mg | Freq: Once | INTRAMUSCULAR | Status: DC | PRN

## 2019-12-27 MED ORDER — SIMETHICONE 80 MG PO CHEW: 80.0000 mg | CHEWABLE_TABLET | ORAL | Status: DC | PRN

## 2019-12-27 MED ORDER — ALBUMIN HUMAN 5 % IV SOLN
INTRAVENOUS | Status: AC
  Filled 2019-12-27: qty 250

## 2019-12-27 MED ORDER — SENNOSIDES-DOCUSATE SODIUM 8.6-50 MG PO TABS
2.0000 | ORAL_TABLET | ORAL | Status: DC
  Administered 2019-12-27 – 2019-12-28 (×2): 2 via ORAL
  Filled 2019-12-27 (×2): qty 2

## 2019-12-27 MED ORDER — LACTATED RINGERS IV SOLN
500.0000 mL | INTRAVENOUS | Status: DC | PRN
  Administered 2019-12-27 (×2): 1000 mL via INTRAVENOUS
  Administered 2019-12-27: 500 mL via INTRAVENOUS

## 2019-12-27 MED ORDER — METHYLERGONOVINE MALEATE 0.2 MG/ML IJ SOLN
INTRAMUSCULAR | Status: AC
  Administered 2019-12-27: 0.2 mg
  Filled 2019-12-27: qty 1

## 2019-12-27 MED ORDER — BENZOCAINE-MENTHOL 20-0.5 % EX AERO: 1.0000 "application " | INHALATION_SPRAY | CUTANEOUS | Status: DC | PRN

## 2019-12-27 MED ORDER — FENTANYL-BUPIVACAINE-NACL 0.5-0.125-0.9 MG/250ML-% EP SOLN
EPIDURAL | Status: AC
  Filled 2019-12-27: qty 250

## 2019-12-27 MED ORDER — MISOPROSTOL 200 MCG PO TABS
ORAL_TABLET | ORAL | Status: AC
  Filled 2019-12-27: qty 1

## 2019-12-27 MED ORDER — OXYTOCIN-SODIUM CHLORIDE 30-0.9 UT/500ML-% IV SOLN
1.0000 m[IU]/min | INTRAVENOUS | Status: DC
  Administered 2019-12-27: 2 m[IU]/min via INTRAVENOUS

## 2019-12-27 MED ORDER — DIPHENHYDRAMINE HCL 50 MG/ML IJ SOLN: 12.5000 mg | INTRAMUSCULAR | Status: DC | PRN

## 2019-12-27 MED ORDER — METHYLERGONOVINE MALEATE 0.2 MG/ML IJ SOLN: 0.2000 mg | Freq: Once | INTRAMUSCULAR | Status: DC

## 2019-12-27 MED ORDER — TRANEXAMIC ACID-NACL 1000-0.7 MG/100ML-% IV SOLN
1000.0000 mg | INTRAVENOUS | Status: AC
  Administered 2019-12-27: 1000 mg via INTRAVENOUS

## 2019-12-27 MED ORDER — MISOPROSTOL 200 MCG PO TABS
ORAL_TABLET | ORAL | Status: AC
  Filled 2019-12-27: qty 4

## 2019-12-27 MED ORDER — LACTATED RINGERS IV SOLN
500.0000 mL | Freq: Once | INTRAVENOUS | Status: AC
  Administered 2019-12-27: 500 mL via INTRAVENOUS

## 2019-12-27 MED ORDER — PHENYLEPHRINE 40 MCG/ML (10ML) SYRINGE FOR IV PUSH (FOR BLOOD PRESSURE SUPPORT)
80.0000 ug | PREFILLED_SYRINGE | INTRAVENOUS | Status: DC | PRN
  Administered 2019-12-27 (×2): 80 ug via INTRAVENOUS
  Filled 2019-12-27: qty 10

## 2019-12-27 MED ORDER — ONDANSETRON HCL 4 MG/2ML IJ SOLN: 4.0000 mg | Freq: Four times a day (QID) | INTRAMUSCULAR | Status: DC | PRN

## 2019-12-27 MED ORDER — DIBUCAINE (PERIANAL) 1 % EX OINT: 1.0000 "application " | TOPICAL_OINTMENT | CUTANEOUS | Status: DC | PRN

## 2019-12-27 MED ORDER — DIPHENHYDRAMINE HCL 25 MG PO CAPS: 25.0000 mg | ORAL_CAPSULE | Freq: Four times a day (QID) | ORAL | Status: DC | PRN

## 2019-12-27 MED ORDER — LIDOCAINE HCL (PF) 1 % IJ SOLN
INTRAMUSCULAR | Status: DC | PRN
  Administered 2019-12-27: 5 mL via EPIDURAL

## 2019-12-27 MED ORDER — COCONUT OIL OIL
1.0000 "application " | TOPICAL_OIL | Status: DC | PRN
  Administered 2019-12-28: 1 via TOPICAL

## 2019-12-27 MED ORDER — PRENATAL MULTIVITAMIN CH
1.0000 | ORAL_TABLET | Freq: Every day | ORAL | Status: DC
  Administered 2019-12-28 – 2019-12-29 (×2): 1 via ORAL
  Filled 2019-12-27 (×2): qty 1

## 2019-12-27 MED ORDER — OXYTOCIN 10 UNIT/ML IJ SOLN: 10.0000 [IU] | Freq: Once | INTRAMUSCULAR | Status: DC | PRN

## 2019-12-27 MED ORDER — PHENYLEPHRINE 40 MCG/ML (10ML) SYRINGE FOR IV PUSH (FOR BLOOD PRESSURE SUPPORT): 80.0000 ug | PREFILLED_SYRINGE | INTRAVENOUS | Status: DC | PRN

## 2019-12-27 MED ORDER — IBUPROFEN 600 MG PO TABS
600.0000 mg | ORAL_TABLET | Freq: Four times a day (QID) | ORAL | Status: DC
  Administered 2019-12-27 – 2019-12-29 (×7): 600 mg via ORAL
  Filled 2019-12-27 (×7): qty 1

## 2019-12-27 MED ORDER — MISOPROSTOL 200 MCG PO TABS
800.0000 ug | ORAL_TABLET | Freq: Once | ORAL | Status: AC
  Administered 2019-12-27: 800 ug via RECTAL

## 2019-12-27 MED ORDER — OXYTOCIN-SODIUM CHLORIDE 30-0.9 UT/500ML-% IV SOLN
2.5000 [IU]/h | INTRAVENOUS | Status: DC
  Filled 2019-12-27: qty 500

## 2019-12-27 MED ORDER — ALBUMIN HUMAN 5 % IV SOLN
12.5000 g | Freq: Once | INTRAVENOUS | Status: AC
  Administered 2019-12-27: 12.5 g via INTRAVENOUS
  Filled 2019-12-27: qty 250

## 2019-12-27 MED ORDER — SOD CITRATE-CITRIC ACID 500-334 MG/5ML PO SOLN: 30.0000 mL | ORAL | Status: DC | PRN

## 2019-12-27 MED ORDER — FENTANYL CITRATE (PF) 100 MCG/2ML IJ SOLN
100.0000 ug | INTRAMUSCULAR | Status: DC | PRN
  Administered 2019-12-27: 100 ug via INTRAVENOUS
  Filled 2019-12-27: qty 2

## 2019-12-27 MED ORDER — ONDANSETRON HCL 4 MG/2ML IJ SOLN: 4.0000 mg | INTRAMUSCULAR | Status: DC | PRN

## 2019-12-27 MED ORDER — OXYCODONE-ACETAMINOPHEN 5-325 MG PO TABS: 2.0000 | ORAL_TABLET | ORAL | Status: DC | PRN

## 2019-12-27 MED ORDER — OXYTOCIN BOLUS FROM INFUSION
333.0000 mL | Freq: Once | INTRAVENOUS | Status: AC
  Administered 2019-12-27: 333 mL via INTRAVENOUS

## 2019-12-27 NOTE — MAU Note (Signed)
Pt reports to MAU stating her water broke around 2345. No bleeding. Pt states no ctx. DFM since LOF +movement just decreased since SROM.

## 2019-12-27 NOTE — Plan of Care (Signed)
  Problem: Education: Goal: Knowledge of General Education information will improve Description: Including pain rating scale, medication(s)/side effects and non-pharmacologic comfort measures Outcome: Completed/Met

## 2019-12-27 NOTE — Discharge Summary (Signed)
Postpartum Discharge Summary     Patient Name: Melanie Macias DOB: 07-Apr-1981 MRN: [id number]  Date of admission: 12/27/2019 Delivery date:   Melanie, Macias [[id number]]  12/27/2019    Melanie, Macias [[id number]]  12/27/2019   Delivering provider:    Ashari, Macias [[id number]]  [place], [place], Macias [[id number]]  Melanie Macias   Date of discharge: 12/29/2019  Admitting diagnosis: Preterm premature rupture of membranes in third trimester [O42.913] Intrauterine pregnancy: [redacted]w[redacted]d    Secondary diagnosis:  Principal Problem:   Postpartum care following vaginal delivery Active Problems:   Monochorionic diamniotic twin gestation   AMA (advanced maternal age) multigravida 35+   Preterm premature rupture of membranes, delivered, current hospitalization   Postpartum hemorrhage  Additional problems: PPH with pRBCs, FFP, methergine, cytotec and Bakri Baloon. Breech twin delivery - breech was twin 2.    Discharge diagnosis: Preterm Pregnancy Delivered and PFoley                                             Post partum procedures:blood transfusion Augmentation: None Complications: [id number](EBL 2500 ml) that occurred four hours after delivery  Hospital course: Onset of Labor With Vaginal Delivery      39y.o. yo [id number] 355w0dith mono-di twinswas admitted in Active Labor on 12/27/2019. Patient had an uncomplicated labor course culminating in a vaginal delivery (second twin breech extraction). Please refer to separate delivery note for further details.  Labor course details are below: Membrane Rupture Time/Date:    [name], [name][[id number]]11:45 PM    [name], [name][[id number]]6:25 AM  ,   [name], [name][[id number]]12/26/2019    [name], [name][[id number]]12/27/2019    Delivery Method:   [name][[id number]]Vaginal, Spontaneous    [name], [name][[id number]]Vaginal, Breech   Episiotomy:    [name], [name][[id number]]None    [name], [name][[id number]]None   Lacerations:     [name], [name][[id number]]1st degree;Vaginal;Perineal    [name], [name][[id number]]1st degree;Vaginal;Perineal   Postpartum course was complicated by postpartum hemorrhage that occurred four hours after delivery.  EBL 2500 ml.  She received multiple uterotonics and had Bakri balloon placed; this was slowly deflated on PPD#1.  She received 3 units of pRBCs and 2 of FFP, hemoglobin after transfusion was 10.5 (pre delivery hemoglobin was 13.5).  No other complications.  By PPD#2, she was ambulating, tolerating a regular diet, passing flatus, and urinating well. Patient is discharged home in stable condition on 12/29/19.  Newborn Data: Birth date:   [name], [name][[id number]]12/27/2019    [name], [name][[id number]]12/27/2019   Birth time:   [name], [name][[id number]]6:[place]0[[id number]]6:33 AM   Gender:   [name], [name][[id number]]Female    [name], [name][[id number]]Female   Living status:   [name], [name][[id number]]  YCXKGY    [name][[id number]]Living   Apgars:   [name], [name][[id number]]9 [address] [place]0[[id number]]6 [place],   [name], [name][[id number]]  62  Melanie, Brodrick Macias [[id number]]  9   Weight:   Melanie, Macias [[id number]]  3215 g    Melanie, Macias [[id number]]  3039 g    Magnesium Sulfate received: No BMZ received: No Rhophylac:No MMR:N/A T-DaP:Given prenatally Flu: No Transfusion:Yes  Physical exam  Vitals:   12/28/19 1535 12/28/19 1959 12/28/19 2332 12/29/19 0526  BP: 126/67 113/69 102/61 115/69  Pulse: 81 70 83 78  Resp: 17 16 16 16   Temp: (!) 97.5 F (36.4 C) (!) 97.5 F (36.4 C) (!) 97.4 F (36.3 C) 97.9 F (36.6 C)  TempSrc: Oral Oral Oral Oral  SpO2: 100% 98% 99% 99%  Weight:      Height:       General: alert, cooperative  and no distress Lochia: appropriate Uterine Fundus: firm Incision: N/A DVT Evaluation: No evidence of DVT seen on physical exam. Negative Homan's sign. No cords or calf tenderness. Labs: Lab Results  Component Value Date   WBC 16.7 (H) 12/28/2019   HGB 10.5 (L) 12/28/2019   HCT 30.0 (L) 12/28/2019   MCV 89.8 12/28/2019   PLT 146 (L) 12/28/2019   No flowsheet data found. Edinburgh Score: Edinburgh Postnatal Depression Scale Screening Tool 12/29/2019  I have been able to laugh and see the funny side of things. 0  I have looked forward with enjoyment to things. 0  I have blamed myself unnecessarily when things went wrong. 1  I have been anxious or worried for no good reason. 1  I have felt scared or panicky for no good reason. 1  Things have been getting on top of me. 1  I have been so unhappy that I have had difficulty sleeping. 0  I have felt sad or miserable. 1  I have been so unhappy that I have been crying. 1  The thought of harming myself has occurred to me. 0  Edinburgh Postnatal Depression Scale Total 6     After visit meds:  Allergies as of 12/29/2019   No Known Allergies     Medication List    STOP taking these medications   acetaminophen 325 MG tablet Commonly known as: TYLENOL   aspirin 81 MG EC tablet     TAKE these medications   acyclovir 800 MG tablet Commonly known as: ZOVIRAX Take 800 mg by mouth as needed (flareups).   Chlor-Trimeton 4 MG tablet Generic drug: chlorpheniramine Take 1 tablet by mouth every 4 (four) hours.   ferrous sulfate 325 (65 FE) MG tablet Take 325 mg by mouth daily with breakfast.   ibuprofen 600 MG tablet Commonly known as: ADVIL Take 1 tablet (600 mg total) by mouth every 6 (six) hours as needed for moderate pain or cramping.   prenatal multivitamin Tabs tablet Take 1 tablet by mouth daily at 12 noon.   senna-docusate 8.6-50 MG tablet Commonly known as: Senokot-S Take 2 tablets by mouth at bedtime as needed for mild  constipation or moderate constipation.        Discharge home in stable condition Infant Feeding: Breast Infant Disposition:home with mother Discharge instruction: per After Visit Summary and Postpartum booklet. Activity: Advance as tolerated. Pelvic rest for 6 weeks.  Diet: routine diet Future Appointments: Future Appointments  Date Time Provider Melanie Macias  01/01/2020  1:00 PM Melanie Kenning [facility]  01/02/2020 12:30 PM [facility] NURSE [facility]  01/02/2020 12:45 PM [facility] US4 [facility]   Delivery mode:     Analiz, Tvedt [[id number]]  Vaginal, Spontaneous  Jaimy, Kliethermes [[id number]]  Vaginal, Breech   Anticipated Birth Control:  Vasectomy but condoms in the interim   12/29/2019 Verita Schneiders, MD

## 2019-12-27 NOTE — Anesthesia Procedure Notes (Signed)
Epidural Patient location during procedure: OB Start time: 12/27/2019 4:42 AM End time: 12/27/2019 4:56 AM  Staffing Anesthesiologist: Trevor Iha, MD Performed: anesthesiologist   Preanesthetic Checklist Completed: patient identified, IV checked, site marked, risks and benefits discussed, surgical consent, monitors and equipment checked, pre-op evaluation and timeout performed  Epidural Patient position: sitting Prep: DuraPrep and site prepped and draped Patient monitoring: continuous pulse ox and blood pressure Approach: midline Location: L3-L4 Injection technique: LOR air  Needle:  Needle type: Tuohy  Needle gauge: 17 G Needle length: 9 cm and 9 Needle insertion depth: 6 cm Catheter type: closed end flexible Catheter size: 19 Gauge Catheter at skin depth: 11 cm Test dose: negative  Assessment Events: blood not aspirated, injection not painful, no injection resistance, no paresthesia and negative IV test  Additional Notes Patient identified. Risks/Benefits/Options discussed with patient including but not limited to bleeding, infection, nerve damage, paralysis, failed block, incomplete pain control, headache, blood pressure changes, nausea, vomiting, reactions to medication both or allergic, itching and postpartum back pain. Confirmed with bedside nurse the patient's most recent platelet count. Confirmed with patient that they are not currently taking any anticoagulation, have any bleeding history or any family history of bleeding disorders. Patient expressed understanding and wished to proceed. All questions were answered. Sterile technique was used throughout the entire procedure. Please see nursing notes for vital signs. Test dose was given through epidural needle and negative prior to continuing to dose epidural or start infusion. Warning signs of high block given to the patient including shortness of breath, tingling/numbness in hands, complete motor block, or any concerning  symptoms with instructions to call for help. Patient was given instructions on fall risk and not to get out of bed. All questions and concerns addressed with instructions to call with any issues. 1 Attempt (S) . Patient tolerated procedure well.

## 2019-12-27 NOTE — Lactation Note (Signed)
This note was copied from a Melanie's chart. Lactation Consultation Note  Patient Name: Melanie Macias WNIOE'V Date: 12/27/2019   Melanie Macias now 42 hours old. Melanie Macias and Melanie Macias. Mom is an experienced breastfeeding mom. Mom breast feed all of her other children.  Mom breastfed first Melanie for two years, second for three years and last Melanie for 5 years. Mom requesting assistance with latch.  Mom has them both on the breast in football position. Assisted with breastfeeding.  Both infants breastfed for 20 mins and then dad fed a bottle of donor milk.   Mom had Post Partum hemorrage.  Mom still getting transfused.  Mom reports she does not feel up to pumping at this time.  Quickly showed her how to use the pump and encouraged her to pump past the every three hours breastfeeding/post pump.  Discussed continuing to offer donor milk past breastfeeds because of their gestational age and mom not feeling up to pumping and Her having post partum complications.  Reviewed LPTi infant handout with parents,  Urged to feed on cue and 8-12 or more times day.  Post pump after the three hour breastfeed.  Feed back all expressed mothers milk.  Keep sts as much as possible.   Reviewed and left [facility] handout.    Maternal Data    Feeding    LATCH Score                   Interventions    Lactation Tools Discussed/Used     Consult Status      Melanie Macias 12/27/2019, 7:50 PM

## 2019-12-27 NOTE — Anesthesia Preprocedure Evaluation (Signed)
Anesthesia Evaluation  Patient identified by MRN, date of birth, ID band Patient awake    Reviewed: Allergy & Precautions, Patient's Chart, lab work & pertinent test results  Airway Mallampati: II  TM Distance: >3 FB Neck ROM: Full    Dental no notable dental hx. (+) Teeth Intact, Dental Advisory Given   Pulmonary neg pulmonary ROS,    Pulmonary exam normal breath sounds clear to auscultation       Cardiovascular Exercise Tolerance: Good negative cardio ROS Normal cardiovascular exam Rhythm:Regular Rate:Normal     Neuro/Psych negative neurological ROS  negative psych ROS   GI/Hepatic negative GI ROS, Neg liver ROS,   Endo/Other  negative endocrine ROS  Renal/GU negative Renal ROS     Musculoskeletal   Abdominal   Peds  Hematology negative hematology ROS (+) Lab Results      Component                Value               Date                      WBC                      11.0 (H)            12/27/2019                HGB                      13.6                12/27/2019                HCT                      40.4                12/27/2019                MCV                      93.5                12/27/2019                PLT                      205                 12/27/2019              Anesthesia Other Findings   Reproductive/Obstetrics (+) Pregnancy Twins                             Anesthesia Physical Anesthesia Plan  ASA: II  Anesthesia Plan: Epidural   Post-op Pain Management:    Induction:   PONV Risk Score and Plan:   Airway Management Planned: Natural Airway  Additional Equipment:   Intra-op Plan:   Post-operative Plan:   Informed Consent: I have reviewed the patients History and Physical, chart, labs and discussed the procedure including the risks, benefits and alternatives for the proposed anesthesia with the patient or authorized representative who has  indicated his/her understanding and acceptance.       Plan Discussed with: Anesthesiologist  Anesthesia Plan Comments: (48 Wk G4P3 w Twin Gestation for LEA)        Anesthesia Quick Evaluation

## 2019-12-27 NOTE — Progress Notes (Signed)
S) Spoke with mom at bedside to follow-up on recent procedures for her postpartum hemorrhage.  She reports that she is feeling significant improvement compared to about 1 hour ago.  She does have some aching in her pelvis but no new, concerning pain.  Her vaginal bleeding has also slowed.   O) BP 108/88   Pulse 82   Temp 98.1 F (36.7 C) (Oral)   Resp 16   Ht 5\' 3"  (1.6 m)   Wt 67.6 kg   LMP 04/19/2019 (Exact Date)   SpO2 100%   Breastfeeding Unknown   BMI 26.39 kg/m   General: Resting in bed comfortably covered in blankets.  Some increase in energy level. Abdomen: The uterus continues to be palpable about 1 cm above the umbilicus.  A/P) Postpartum hemorrhage S/p Cytotec, TXA, Methergine.  Now receiving first of 2 bags PRBCs. -Leave Bakri balloon in until tomorrow morning.  Begin decreasing volume in balloon tomorrow morning. -Continue transfusion.  Start FFP following first bag PRBCs. -Mom will continue be seen by [facility] specialty care

## 2019-12-27 NOTE — Progress Notes (Signed)
Called to room by RN to assess bleeding, patient was lying in bed, alert and oriented times 3, VSS. Uterus was firm, above umbilicus and deviated to patients' right. Foley catheter placed, small amount of blood expressed from uterus. Plan to reassess after Foley catheter placed.   Called to room by RN at 1037 to reassess bleeding; patient was in wheelchair and then became unresponsive before leaving the room. Anesthesia at the bedside; albumin bolus given. BPs 50-80/40-70s, PPH labs drawn.  Lower uterine segment digital exam performed and large clots expressed. Cytotec, methergine, TXA, given, Dr. Debroah Loop notified.   Dr. Debroah Loop at bedside at 11:10 4 units of blood ordered and 2 units of FFP to be administered. Fentanyl given and manual sweep performed by Dr. Debroah Loop, with 1000 ml of blood clots removed. Bakri balloon placed; will stay in for 24 hours and will monitor down on labor and delivery.   Keep Bakri in until am on 12/28/2019.   Melanie Macias

## 2019-12-27 NOTE — Progress Notes (Signed)
Patient ID: Melanie Macias, female   DOB: [birth year], 39 y.o.   MRN: [id number] From note by Paulina Fusi CNM  Called to room by RN at 1037 to reassess bleeding; patient was in wheelchair and then became unresponsive before leaving the room. Anesthesia at the bedside; albumin bolus given. BPs 50-80/40-70s, PPH labs drawn.  Lower uterine segment digital exam performed and large clots expressed. Cytotec, methergine, TXA, given, Dr. Debroah Loop notified.   Dr. Debroah Loop at bedside at 11:10 4 units of blood ordered and 2 units of FFP to be administered. Fentanyl given and manual sweep performed by Dr. Debroah Loop, with 1000 ml of blood clots removed. Bakri balloon placed; will stay in for 24 hours and will monitor down on labor and delivery.   Keep Bakri in until am on 12/28/2019.   I performed the exam as described and agree with the above note. Will transfuse 4 units with 2 units FFP   Adam Phenix, MD 12/27/2019

## 2019-12-27 NOTE — H&P (Signed)
OBSTETRIC ADMISSION HISTORY AND PHYSICAL  Melanie Macias is a 39 y.o. female [phone] with IUP at [redacted]w[redacted]d by LMP presenting for PROM.   Reports fetal movement x2. Denies vaginal bleeding.  She received her prenatal care at [facility].  Support person in labor: Melanie Macias (spouse)  Ultrasounds . Anatomy U/S: Mono-Di Twins  Normal anatomy  Latest U/S (12/26/19): (A) Vtx, EFW 7 lbs 5 oz (93%); (B) Breech, EFW 6 lbs 11 oz (77%)  Prenatal History/Complications: Marland Kitchen Mono-Di Twins . Thrombocytopenia (plts 149 on 10/30/19) . AMA (>40 yo at delivery)  OB BOX:  Nursing Staff Provider  Office Location [facility] Dating  LMP + early [place]  Language  English Anatomy US  Marginal cord insertion A, Velamentous cord insertion B  Flu Vaccine   Genetic Screen  NIPS: low risk males monozygotic  AFP: negative   TDaP vaccine  10/30/19 Hgb A1C or  GTT Early  Third trimester - normal 2hr  Rhogam  NA   LAB RESULTS     Blood Type   O positive  Feeding Plan Breast Antibody  Neg  Contraception Vasectomy Rubella Immune  Circumcision Yes RPR Nonreactive (02/08 0000)   Pediatrician  Melanie Sites, MD ([facility]) HBsAg Negative (02/08 0000)   Support Person Melanie Macias HCVAb Negative  Prenatal Classes  HIV Non Reactive (02/08 0000)     BTL Consent NA GBS  (For PCN allergy, check sensitivities)   VBAC Consent NA Pap 2018 - normal @ [facility]    Hgb Electro    BP Cuff Ordered 10/02/19 CF     SMA     Waterbirth  [ ]  Class [ ]  Consent [ ]  CNM visit   Past Medical History: Past Medical History:  Diagnosis Date  . Anal fissure   . Anemia    Currently taking FeSO4 supp  . GBS (group B streptococcus) infection 2012  . Heart murmur    @ birth;grew out of  . History of blood transfusion 2001   Hgb @ 4.5  . NSVD (normal spontaneous vaginal delivery) 12/23/2012  . Preterm labor    with both pregnancies around 26 weeks;was given Terb injection and partial bedrest for both;then goes away @ around 30 weeks  . Thrombocytopenia,  unspecified (HCC) 01/03/2013   Platelets=125k on admission on 12/23/12 On PPD#1=136    Past Surgical History: Past Surgical History:  Procedure Laterality Date  . WISDOM TOOTH EXTRACTION      Obstetrical History: OB History    Gravida  4   Para  3   Term  3   Preterm      AB      Living  3     SAB      TAB      Ectopic      Multiple      Live Births  3           Social History: Social History   Socioeconomic History  . Marital status: Married    Spouse name: Melanie Macias  . Number of children: 3  . Years of education: 51  . Highest education level: Not on file  Occupational History  . Occupation: SAHM  Tobacco Use  . Smoking status: Never Smoker  . Smokeless tobacco: Never Used  Vaping Use  . Vaping Use: Never used  Substance and Sexual Activity  . Alcohol use: No  . Drug use: No  . Sexual activity: Yes    Birth control/protection: None  Other Topics Concern  .  Not on file  Social History Narrative  . Not on file   Social Determinants of Health   Financial Resource Strain:   . Difficulty of Paying Living Expenses:   Food Insecurity: No Food Insecurity  . Worried About Programme researcher, broadcasting/film/video in the Last Year: Never true  . Ran Out of Food in the Last Year: Never true  Transportation Needs: No Transportation Needs  . Lack of Transportation (Medical): No  . Lack of Transportation (Non-Medical): No  Physical Activity:   . Days of Exercise per Week:   . Minutes of Exercise per Session:   Stress:   . Feeling of Stress :   Social Connections:   . Frequency of Communication with Friends and Family:   . Frequency of Social Gatherings with Friends and Family:   . Attends Religious Services:   . Active Member of Clubs or Organizations:   . Attends Banker Meetings:   Marland Kitchen Marital Status:     Family History: Family History  Problem Relation Age of Onset  . Stroke Maternal Grandmother   . Diabetes Maternal Grandmother   . Dementia  Maternal Grandmother   . Congestive Heart Failure Maternal Grandmother   . Seizures Mother        x1  . Breast cancer Maternal Aunt 40       possibly prior to menopause  . Bipolar disorder Paternal Aunt   . Diabetes Maternal Grandfather   . Congestive Heart Failure Maternal Grandfather   . Hypertension Paternal Grandmother   . Lung cancer Father        Stage 3    Allergies: No Known Allergies  Medications Prior to Admission  Medication Sig Dispense Refill Last Dose  . acetaminophen (TYLENOL) 325 MG tablet Take 650 mg by mouth every 6 (six) hours as needed for pain.    12/26/2019 at Unknown time  . aspirin 81 MG EC tablet Take by mouth.   12/26/2019 at Unknown time  . chlorpheniramine (CHLOR-TRIMETON) 4 MG tablet Take 1 tablet by mouth every 4 (four) hours.    12/26/2019 at Unknown time  . ferrous sulfate 325 (65 FE) MG tablet 1 tablet.   12/26/2019 at Unknown time  . Prenatal Vit-Fe Fumarate-FA (PRENATAL MULTIVITAMIN) TABS tablet Take 1 tablet by mouth daily at 12 noon.   12/26/2019 at Unknown time  . acyclovir (ZOVIRAX) 800 MG tablet Take 800 mg by mouth as needed. (Patient not taking: Reported on 12/26/2019)        Review of Systems  All systems reviewed and negative except as stated in HPI  Blood pressure (!) 133/92, pulse 77, temperature 97.8 F (36.6 C), temperature source Oral, resp. rate 16, height 5\' 3"  (1.6 m), weight 67.6 kg, last menstrual period 04/19/2019, SpO2 97 %, unknown if currently breastfeeding.   Physical Exam General appearance: alert, cooperative and no distress Lungs: no respiratory distress Heart: regular rate  Abdomen: soft, non-tender; gravid b Pelvic: adequate gynecoid pelvis Extremities: Homans sign is negative, no sign of DVT Presentation: cephalic Twin A, breech Twin B by U/S on 12/26/19 Fetal monitoring: Twin A: 135 bpm / moderate variability / accels present / decels absent / Twin B: 140 bpm / moderate variability / accels present / decels absent  Uterine  activity: TOCO: irregular every 2-7 mins  Dilation: 5 Effacement (%): 80 Station: -3 Exam by:: Mozelle Remlinger,CNM  Prenatal labs: ABO, Rh: --/--/O POS Performed at [facility], [address] [place], [place]  [phone]  [phone]) Antibody: NEG (08/07 0200) Rubella: Nonimmune (02/08 0000) RPR: Non Reactive (06/10 0922)  HBsAg: Negative (02/08 0000)  HIV: Non Reactive (06/10 0922)  GBS:  Pending Glucola: Normal 2 hr ([phone]) Genetic screening:  Low risk  Prenatal Transfer Tool  Maternal Diabetes: No Genetic Screening: Normal Maternal Ultrasounds/Referrals: Normal Fetal Ultrasounds or other Referrals:  None Maternal Substance Abuse:  No Significant Maternal Medications:  None Significant Maternal Lab Results: Other: GBS Pedning  Results for orders placed or performed during the hospital encounter of 12/27/19 (from the past 24 hour(s))  Fern Test   Collection Time: 12/27/19  1:32 AM  Result Value Ref Range   POCT Fern Test Positive = ruptured amniotic membanes   SARS Coronavirus 2 by RT PCR (hospital order, performed in [facility] hospital lab) Nasopharyngeal Nasopharyngeal Swab   Collection Time: 12/27/19  1:51 AM   Specimen: Nasopharyngeal Swab  Result Value Ref Range   SARS Coronavirus 2 NEGATIVE NEGATIVE  Type and screen [facility]   Collection Time: 12/27/19  2:00 AM  Result Value Ref Range   ABO/RH(D) O POS    Antibody Screen NEG    Sample Expiration      12/30/2019,2359 Performed at [facility], [address], [place], [place] [address]   CBC   Collection Time: 12/27/19  2:36 AM  Result Value Ref Range   WBC 11.0 (H) 4.0 - 10.5 K/uL   RBC 4.32 3.87 - 5.11 MIL/uL   Hemoglobin 13.6 12.0 - 15.0 g/dL   HCT 15.7 36 - 46 %   MCV 93.5 80.0 - 100.0 fL   MCH 31.5 26.0 - 34.0 pg   MCHC 33.7 30.0 - 36.0 g/dL   RDW 26.2 03.5 - 59.7 %   Platelets 205 150 - 400 K/uL   nRBC 0.0 0.0 - 0.2 %  ABO/Rh   Collection Time: 12/27/19  3:02 AM   Result Value Ref Range   ABO/RH(D)      O POS Performed at [facility], [address], [place], [place] [address]     Patient Active Problem List   Diagnosis Date Noted  . Preterm premature rupture of membranes in third trimester 12/27/2019  . AMA (advanced maternal age) multigravida 35+ 10/03/2019  . Supervision of high risk pregnancy, antepartum 09/04/2019  . Monochorionic diamniotic twin gestation 09/04/2019    Assessment/Plan:  EMAAN GARY is a 39 y.o. [phone] at [redacted]w[redacted]d here for PPROM with Mono-Di twins (vtx/breech)  Labor: Active -- pain control: none -- considering epidural  Fetal Wellbeing:  -- Category 1 tracing x 2 -- Twin A Cephalic (Occiptal Posterior) by U/S.  -- GBS (unknown) -- no treatment needed per Dr. Alysia Penna -- continuous fetal monitoring   Postpartum Planning -- breastfeeding -- Tdap - done prenatally   Raelyn Mora, CNM  12/27/2019, 2:47 AM

## 2019-12-28 LAB — BPAM FFP
Blood Product Expiration Date: 202108112359
ISSUE DATE / TIME: 202108071116
Unit Type and Rh: 6200

## 2019-12-28 LAB — PREPARE FRESH FROZEN PLASMA: Unit division: 0

## 2019-12-28 LAB — CBC
HCT: 30 % — ABNORMAL LOW (ref 36.0–46.0)
Hemoglobin: 10.5 g/dL — ABNORMAL LOW (ref 12.0–15.0)
MCH: 31.4 pg (ref 26.0–34.0)
MCHC: 35 g/dL (ref 30.0–36.0)
MCV: 89.8 fL (ref 80.0–100.0)
Platelets: 146 10*3/uL — ABNORMAL LOW (ref 150–400)
RBC: 3.34 MIL/uL — ABNORMAL LOW (ref 3.87–5.11)
RDW: 14.6 % (ref 11.5–15.5)
WBC: 16.7 10*3/uL — ABNORMAL HIGH (ref 4.0–10.5)
nRBC: 0 % (ref 0.0–0.2)

## 2019-12-28 LAB — GC/CHLAMYDIA PROBE AMP (~~LOC~~) NOT AT ARMC
Chlamydia: NEGATIVE
Comment: NEGATIVE
Comment: NORMAL
Neisseria Gonorrhea: NEGATIVE

## 2019-12-28 NOTE — Progress Notes (Signed)
100cc clear fluid removed from Bakri Balloon per MD order.

## 2019-12-28 NOTE — Lactation Note (Signed)
This note was copied from a baby's chart. Lactation Consultation Note  Patient Name: Melanie Macias [id number] Date: 12/28/2019  Babies Huddle and Melynda Ripple in cribs in arrival. Baby boys now 23 hours old with 5 percent weight loss first night.  Mom reports she has started  pumping and has pumped two times so far.  Discussed continuing to post pump and massage and hand express since infants were LPTI and mom with large blood loss and PPH. Discussed continuing to supplement with donor breastmilk past breastfeeds as well.  Reviewed recommended amounts and urged to offer more if they wanted more.   Praised efforts .  Urged mom to call lactation as needed.      Sarahgrace Broman Michaelle Copas 12/28/2019, 4:43 PM

## 2019-12-28 NOTE — Progress Notes (Signed)
Post Partum Day 1 Subjective: no complaints and tolerating PO  Objective: Blood pressure 109/71, pulse 77, temperature 98.4 F (36.9 C), temperature source Oral, resp. rate 16, height 5\' 3"  (1.6 m), weight 67.6 kg, last menstrual period 04/19/2019, SpO2 96 %, unknown if currently breastfeeding.  Physical Exam:  General: alert, cooperative and no distress Lochia: appropriate Uterine Fundus: firm at umbilicus DVT Evaluation: No evidence of DVT seen on physical exam.  Recent Labs    12/27/19 0236 12/27/19 1045  HGB 13.6 9.2*  HCT 40.4 27.5*    Assessment/Plan: Deflate Bakrii over 2-4 hr and remove CBC this am after transfusion   LOS: 1 day   02/26/20 12/28/2019, 7:31 AM

## 2019-12-29 ENCOUNTER — Ambulatory Visit: Payer: Self-pay

## 2019-12-29 LAB — BPAM FFP
Blood Product Expiration Date: 202108112359
ISSUE DATE / TIME: 202108071646
Unit Type and Rh: 6200

## 2019-12-29 LAB — PREPARE FRESH FROZEN PLASMA: Unit division: 0

## 2019-12-29 MED ORDER — IBUPROFEN 600 MG PO TABS: 600.0000 mg | ORAL_TABLET | Freq: Four times a day (QID) | ORAL | 2 refills | Status: DC | PRN

## 2019-12-29 MED ORDER — MEASLES, MUMPS & RUBELLA VAC IJ SOLR
0.5000 mL | Freq: Once | INTRAMUSCULAR | Status: AC
  Administered 2019-12-29: 0.5 mL via SUBCUTANEOUS
  Filled 2019-12-29: qty 0.5

## 2019-12-29 MED ORDER — SENNOSIDES-DOCUSATE SODIUM 8.6-50 MG PO TABS: 2.0000 | ORAL_TABLET | Freq: Every evening | ORAL | 2 refills | Status: DC | PRN

## 2019-12-29 NOTE — Plan of Care (Signed)
  Problem: Elimination: Goal: Will not experience complications related to bowel motility Outcome: Adequate for Discharge Goal: Will not experience complications related to urinary retention Outcome: Adequate for Discharge   Problem: Education: Goal: Knowledge of condition will improve Outcome: Adequate for Discharge Goal: Individualized Educational Video(s) Outcome: Adequate for Discharge Goal: Individualized Newborn Educational Video(s) Outcome: Adequate for Discharge   Problem: Activity: Goal: Will verbalize the importance of balancing activity with adequate rest periods Outcome: Adequate for Discharge Goal: Ability to tolerate increased activity will improve Outcome: Adequate for Discharge   Problem: Coping: Goal: Ability to identify and utilize available resources and services will improve Outcome: Adequate for Discharge   Problem: Life Cycle: Goal: Chance of risk for complications during the postpartum period will decrease Outcome: Adequate for Discharge   Problem: Role Relationship: Goal: Ability to demonstrate positive interaction with newborn will improve Outcome: Adequate for Discharge   Problem: Skin Integrity: Goal: Demonstration of wound healing without infection will improve Outcome: Adequate for Discharge

## 2019-12-29 NOTE — Discharge Instructions (Signed)

## 2019-12-29 NOTE — Lactation Note (Signed)
This note was copied from a baby's chart. Lactation Consultation Note  Patient Name: Melanie Macias [id number] Date: 12/29/2019 Reason for consult: Follow-up assessment;Late-preterm 34-36.6wks;Multiple gestation;Other (Comment) (PPH)  LC in to visit with P5 Mom of 36w twins.  Babies are 62 hrs old and [name] is at 7% and [name] is at 6% weight loss and both have great output and bilirubin levels good.  Babies are both being offered breast, following with EBM+/donor breast milk per LPTI guidelines.  Today babies are being supplemented 20-30 ml by paced bottle.    Mom has some nipple soreness, short positional stripe noted on nipple tips.  Mom has erect and ample size nipples for small breasts.  Mom aware of importance of a deep latch to breast.  Mom given Comfort Gels to use.  Reminded her of importance of using her EBM onto nipple with hand expression.  Baby A "Hobs' stirring and offered to assist/assess with a breastfeeding.  Breasts are filling today on palpation.  Hand expression showed what looked like transitional milk which sprayed from nipple.   Baby positioned in football hold and latched easily.  Mom instructed to support baby's head to facilitate a more controlled latch, while supporting, compressing her breast at the base of breast.  Baby latched and fed well with nice jaw extensions and pauses hearing swallows.  Mom denied any pain with latch, other than from nipple trauma already on nipple.    Baby B "Huddle" woke after his brother finished and Mom able to latch him independently with verbal assistance.  Baby latched on first attempt and deeply to breast.  Baby noted to be nutritive with regular swallows identified.    Recommended to Mom that she rent a Medela Symphony from gift shop due to having a borrowed pump from a friend that is a Risk analyst.    Plan- written and handed to Mom 1- Offer breast with cues.  STS and deep latch.  Limit to 30 mins to avoid over  tiring babies. Goal is to feed 8-12 times per 24 hrs 2- Pace bottle feed per guidelines EBM+/donor milk 20-30 ml increasing prn 3-Pump both breasts 15-30 mins. 4- OP lactation follow-up (message sent to [facility] RN [facility]  Mom aware of OP lactation support and encouraged to call prn.  Engorgement prevention and treatment reviewed.   Feeding Feeding Type: Breast Fed  LATCH Score Latch: Grasps breast easily, tongue down, lips flanged, rhythmical sucking.  Audible Swallowing: Spontaneous and intermittent  Type of Nipple: Everted at rest and after stimulation  Comfort (Breast/Nipple): Filling, red/small blisters or bruises, mild/mod discomfort  Hold (Positioning): Assistance needed to correctly position infant at breast and maintain latch.  LATCH Score: 8  Interventions Interventions: Breast feeding basics reviewed;Assisted with latch;Skin to skin;Breast massage;Hand express;Breast compression;Adjust position;Support pillows;Position options;Expressed milk;Comfort gels;DEBP;Hand pump  Lactation Tools Discussed/Used Tools: Pump;Bottle;Comfort gels Breast pump type: Double-Electric Breast Pump   Consult Status Consult Status: Complete Date: 12/29/19 Follow-up type: Out-patient    Melanie Macias 12/29/2019, 2:27 PM

## 2019-12-30 LAB — BPAM RBC
Blood Product Expiration Date: 202109022359
Blood Product Expiration Date: 202109022359
Blood Product Expiration Date: 202109022359
Blood Product Expiration Date: 202109022359
ISSUE DATE / TIME: 202108071113
ISSUE DATE / TIME: 202108071113
ISSUE DATE / TIME: 202108071113
ISSUE DATE / TIME: 202108071113
Unit Type and Rh: 5100
Unit Type and Rh: 5100
Unit Type and Rh: 5100
Unit Type and Rh: 5100

## 2019-12-30 LAB — TYPE AND SCREEN
ABO/RH(D): O POS
Antibody Screen: NEGATIVE
Unit division: 0
Unit division: 0
Unit division: 0
Unit division: 0

## 2019-12-31 LAB — CULTURE, BETA STREP (GROUP B ONLY): Strep Gp B Culture: NEGATIVE

## 2019-12-31 LAB — SURGICAL PATHOLOGY

## 2020-01-02 ENCOUNTER — Ambulatory Visit: Payer: Medicaid Other

## 2020-01-03 NOTE — Anesthesia Postprocedure Evaluation (Signed)
Anesthesia Post Note  Patient: Melanie Macias  Procedure(s) Performed: AN AD HOC LABOR EPIDURAL     Patient location during evaluation: Mother Baby Anesthesia Type: Epidural Level of consciousness: awake and alert Pain management: pain level controlled Vital Signs Assessment: post-procedure vital signs reviewed and stable Respiratory status: spontaneous breathing, nonlabored ventilation and respiratory function stable Cardiovascular status: stable Postop Assessment: no headache, no backache and epidural receding Anesthetic complications: no   No complications documented.  Last Vitals:  Vitals:   12/29/19 0936 12/29/19 1244  BP: 123/72 134/74  Pulse: 80 69  Resp: 18 18  Temp: 36.9 C 36.6 C  SpO2: 99% 100%    Last Pain:  Vitals:   12/29/19 1244  TempSrc: Oral  PainSc:                  Trevor Iha

## 2020-01-14 ENCOUNTER — Other Ambulatory Visit: Payer: Self-pay | Admitting: Lactation Services

## 2020-01-14 MED ORDER — NYSTATIN 100000 UNIT/GM EX CREA
TOPICAL_CREAM | CUTANEOUS | 1 refills | Status: DC
Start: 2020-01-14 — End: 2021-03-29

## 2020-01-14 NOTE — Progress Notes (Signed)
Twins with oral Thrush, mom to be treated simultaneously.

## 2020-01-24 ENCOUNTER — Inpatient Hospital Stay (HOSPITAL_COMMUNITY): Admit: 2020-01-24 | Payer: Self-pay

## 2020-02-06 ENCOUNTER — Encounter: Payer: Self-pay | Admitting: Lactation Services

## 2020-02-06 ENCOUNTER — Encounter: Payer: Self-pay | Admitting: Medical

## 2020-02-06 ENCOUNTER — Other Ambulatory Visit (HOSPITAL_COMMUNITY)
Admission: RE | Admit: 2020-02-06 | Discharge: 2020-02-06 | Disposition: A | Discharge status: Home / Self Care | Payer: Medicaid Other | Source: Ambulatory Visit | Attending: Medical | Admitting: Medical

## 2020-02-06 ENCOUNTER — Other Ambulatory Visit: Payer: Self-pay | Admitting: Lactation Services

## 2020-02-06 ENCOUNTER — Other Ambulatory Visit: Payer: Self-pay

## 2020-02-06 ENCOUNTER — Ambulatory Visit (INDEPENDENT_AMBULATORY_CARE_PROVIDER_SITE_OTHER): Payer: Medicaid Other | Admitting: Medical

## 2020-02-06 DIAGNOSIS — Z8759 Personal history of other complications of pregnancy, childbirth and the puerperium: Secondary | ICD-10-CM | POA: Insufficient documentation

## 2020-02-06 NOTE — Patient Instructions (Signed)

## 2020-02-06 NOTE — Progress Notes (Signed)
Opened in error

## 2020-02-06 NOTE — Progress Notes (Signed)
Spoke with patient when she was in for her PP exam.   She rpeorts infants were gaining well and at 1 month, was told by Peds to go ahead and just nurse infants. Mom did so for about 24 hours and infants seemed hungry all day. She then went back to nursing, pumping and supplementing infants.   She reports infants will nurse and only in the morning do not need supplement, they are taking 2-4 ounces after breast feeding at other times during the day. They will take 5 ounces when they are being bottle fed. Mom is pumping with most feeds as she is able while taking care of 5 children. She is using a few bottles of donor milk daily for infants.   She feels she is eating, drinking, and resting as well as possible. She is taking Fenugreek 2 capsules daily. Reviewed that dosage for milk production is 2-3 capsules TID. Reviewed that she may want to increase to 2 times a day and then increase to 3 times a day to monitor for side effects.   Link Snuffer still has Thrush and still using the Nipple shield. She is restarting Nystatin. Gave handout on Gentian Violet to review if Nystatin does not work.   Mom voiced understanding to all the above. She will call as needed.

## 2020-02-06 NOTE — Progress Notes (Signed)
Post Partum Visit Note  Melanie Macias is a 39 y.o. [phone] female who presents for a postpartum visit. She is 5 weeks postpartum following a normal spontaneous vaginal delivery of mono/di twins.  I have fully reviewed the prenatal and intrapartum course. The delivery was at 36.0 gestational weeks.  Anesthesia: epidural. Postpartum course has been normal. Babies is doing well. Babies are feeding by breast. Bleeding staining only. Bowel function is normal. Bladder function is normal. Patient is not sexually active. Contraception method is abstinence. Postpartum depression screening: negative.     Edinburgh Postnatal Depression Scale - 02/06/20 1122      Edinburgh Postnatal Depression Scale:  In the Past 7 Days   I have been able to laugh and see the funny side of things. 0    I have looked forward with enjoyment to things. 0    I have blamed myself unnecessarily when things went wrong. 0    I have been anxious or worried for no good reason. 0    I have felt scared or panicky for no good reason. 0    Things have been getting on top of me. 2    I have been so unhappy that I have had difficulty sleeping. 0    I have felt sad or miserable. 0    I have been so unhappy that I have been crying. 0    The thought of harming myself has occurred to me. 0    Edinburgh Postnatal Depression Scale Total 2            The following portions of the patient's history were reviewed and updated as appropriate: allergies, current medications, past family history, past medical history, past social history, past surgical history and problem list.  Review of Systems Pertinent items are noted in HPI.    Objective:  Blood pressure 107/73, pulse 67, height 5\' 3"  (1.6 m), weight 118 lb 6.4 oz (53.7 kg), last menstrual period 04/19/2019, currently breastfeeding.  General:  alert and cooperative   Breasts:  not performed  Lungs: clear to auscultation bilaterally  Heart:  regular rate and rhythm, S1, S2  normal, no murmur, click, rub or gallop  Abdomen: soft, non-tender; bowel sounds normal; no masses,  no organomegaly   Vulva:  normal  Vagina: normal vagina, no discharge, exudate, lesion, or erythema  Cervix:  multiparous appearance, no bleeding following Pap and no lesions  Corpus: normal size, contour, position, consistency, mobility, non-tender  Adnexa:  normal adnexa and no mass, fullness, tenderness  Rectal Exam: Not performed.        Assessment:   Normal postpartum exam.  History of mono/di twins  History of PPH  Plan:   Essential components of care per ACOG recommendations:  1.  Mood and well being: Patient with negative depression screening today. Reviewed local resources for support.  - Patient does not use tobacco.  - hx of drug use? No    2. Infant care and feeding:  -Patient currently breastmilk feeding? Yes  -Social determinants of health (SDOH) reviewed in EPIC. No concerns  3. Sexuality, contraception and birth spacing - Patient does not want a pregnancy in the next year.  Desired family size is 5 children.  - Reviewed forms of contraception in tiered fashion. Patient desires vasectomy    4. Sleep and fatigue -Encouraged family/partner/community support of 4 hrs of uninterrupted sleep to help with mood and fatigue  5. Physical Recovery  - Discussed patients delivery and complications -  Patient had a first degree laceration, perineal healing reviewed. Patient expressed understanding - Patient has urinary incontinence? No  - Patient is not safe to resume physical and sexual activity. Minimum of 6 weeks is recommended.   6.  Health Maintenance - Last pap smear done 2018 and was normal with negative HPV. - Pap smear today  - Patient will schedule mammogram when no longer breastfeeding    Vonzella Nipple, PA-C [facility] [employer]

## 2020-02-06 NOTE — Addendum Note (Signed)
Addended by: Henrietta Dine on: 02/06/2020 12:27 PM   Modules accepted: Orders

## 2020-02-09 ENCOUNTER — Encounter: Payer: Self-pay | Admitting: Lactation Services

## 2020-02-09 LAB — CYTOLOGY - PAP
Comment: NEGATIVE
Diagnosis: NEGATIVE
High risk HPV: NEGATIVE

## 2020-10-06 ENCOUNTER — Other Ambulatory Visit: Payer: Self-pay | Admitting: Lactation Services

## 2020-10-06 ENCOUNTER — Encounter: Payer: Self-pay | Admitting: Medical

## 2020-10-06 MED ORDER — FLUCONAZOLE 150 MG PO TABS
150.0000 mg | ORAL_TABLET | Freq: Once | ORAL | 0 refills | Status: AC
Start: 2020-10-06 — End: 2020-10-06

## 2020-10-06 NOTE — Progress Notes (Signed)
Diflucan ordered per standing order for s/s vaginal yeast infection. Patient notified of prescription via my chart.

## 2021-03-07 ENCOUNTER — Ambulatory Visit: Payer: Medicaid Other

## 2021-03-17 ENCOUNTER — Telehealth: Payer: Self-pay | Admitting: Family Medicine

## 2021-03-17 ENCOUNTER — Other Ambulatory Visit: Payer: Self-pay | Admitting: Obstetrics & Gynecology

## 2021-03-17 NOTE — Telephone Encounter (Signed)
Patient called stating she was told she needed an order for a diagnostic test for her mammogram. Patient was told a note would be sent to the nurse to speak with the provider in regards to the order and she would receive a call back.

## 2021-03-17 NOTE — Telephone Encounter (Signed)
Patient called stating there is a spot on her breast and she needs a mammogram. When [facility] they told her to get a diagnostic order from her provider.

## 2021-03-17 NOTE — Telephone Encounter (Signed)
Opened in error

## 2021-03-18 ENCOUNTER — Telehealth: Payer: Self-pay | Admitting: Family Medicine

## 2021-03-18 NOTE — Telephone Encounter (Signed)
Called patient in regards to a gyn visit but there was no answer to the phone call so a voicemail was left to call back the office at.

## 2021-03-18 NOTE — Telephone Encounter (Signed)
I called Melanie Macias to discuss her request. She reports she has never had a mammogram and called to schedule it and referred to call us to get order for diagnostic. She reports she does not have a lump, but has area that is tender and burns. Denies fever, denies redness, denies discharge. States she stopped breastfeeding at least 2 or more months ago.  I informed her that they may not be able to do a mammogram due to recent breastfeeding ; but we need to get her into office asap for breast evaluation by provider; then provider will determine what needs to be done. I explained I will message front office and they will schedule appointment and reach out to her asap. She is willing to see any provider so she can get in sooner. Johnsie Moscoso,RN

## 2021-03-29 ENCOUNTER — Ambulatory Visit (INDEPENDENT_AMBULATORY_CARE_PROVIDER_SITE_OTHER): Payer: Medicaid Other

## 2021-03-29 ENCOUNTER — Other Ambulatory Visit: Payer: Self-pay

## 2021-03-29 ENCOUNTER — Other Ambulatory Visit (HOSPITAL_COMMUNITY)
Admission: RE | Admit: 2021-03-29 | Discharge: 2021-03-29 | Disposition: A | Discharge status: Home / Self Care | Payer: Medicaid Other | Source: Ambulatory Visit | Attending: Family Medicine | Admitting: Family Medicine

## 2021-03-29 VITALS — BP 108/82 | HR 67 | Ht 63.0 in | Wt 107.3 lb

## 2021-03-29 DIAGNOSIS — N898 Other specified noninflammatory disorders of vagina: Secondary | ICD-10-CM | POA: Insufficient documentation

## 2021-03-29 DIAGNOSIS — Z23 Encounter for immunization: Secondary | ICD-10-CM | POA: Diagnosis not present

## 2021-03-29 NOTE — Progress Notes (Signed)
Chart reviewed for nurse visit. Agree with plan of care.   Venora Maples, MD 03/29/21 12:42 PM

## 2021-03-29 NOTE — Progress Notes (Signed)
Pt here today for c/o vaginal discharge, burning and itching x 2 weeks. Denies any odor. Denies any urinary symptoms.  LMP 03/12/21. Pt had intercourse last Thursday and noticed slight pink spotting afterwards as well. Pt states had yeast infections in the past. Has not used any OTC treatments.   Pt advised to do self swab today before treating due to different symptoms. Self swab collected today. Pt advised results will take 24-48 hours and will see results in mychart and will be notified if needs further treatment. Pt verbalized understanding.   Judeth Cornfield, RN

## 2021-03-30 LAB — CERVICOVAGINAL ANCILLARY ONLY
Bacterial Vaginitis (gardnerella): POSITIVE — AB
Candida Glabrata: NEGATIVE
Candida Vaginitis: POSITIVE — AB
Chlamydia: NEGATIVE
Comment: NEGATIVE
Comment: NEGATIVE
Comment: NEGATIVE
Comment: NEGATIVE
Comment: NEGATIVE
Comment: NORMAL
Neisseria Gonorrhea: NEGATIVE
Trichomonas: NEGATIVE

## 2021-03-31 ENCOUNTER — Other Ambulatory Visit: Payer: Self-pay | Admitting: Family Medicine

## 2021-03-31 MED ORDER — FLUCONAZOLE 150 MG PO TABS
150.0000 mg | ORAL_TABLET | Freq: Once | ORAL | 0 refills | Status: AC
Start: 2021-03-31 — End: 2021-03-31

## 2021-03-31 MED ORDER — METRONIDAZOLE 500 MG PO TABS: 500.0000 mg | ORAL_TABLET | Freq: Two times a day (BID) | ORAL | 0 refills | Status: AC

## 2021-04-01 NOTE — Progress Notes (Signed)
   GYNECOLOGY OFFICE VISIT NOTE  History:   Melanie Macias is a 40 y.o. [phone] here today for breast pain.  Started: 1-2 months ago Side: right side worse than the left Area: outer breast toward but not in the axilla Severity: mild-mod.   She has not yet had a mammogram.   She denies any abnormal vaginal discharge, bleeding, pelvic pain or other concerns.     Past Medical History:  Diagnosis Date   Anal fissure    Anemia    Currently taking FeSO4 supp   GBS (group B streptococcus) infection 2012   Heart murmur    @ birth;grew out of   History of blood transfusion 2001   Hgb @ 4.5   NSVD (normal spontaneous vaginal delivery) 12/23/2012   Preterm labor    with both pregnancies around 26 weeks;was given Terb injection and partial bedrest for both;then goes away @ around 30 weeks   Thrombocytopenia, unspecified (HCC) 01/03/2013   Platelets=125k on admission on 12/23/12 On PPD#1=136    Past Surgical History:  Procedure Laterality Date   WISDOM TOOTH EXTRACTION      The following portions of the patient's history were reviewed and updated as appropriate: allergies, current medications, past family history, past medical history, past social history, past surgical history and problem list.   Health Maintenance:   Diagnosis  Date Value Ref Range Status  02/06/2020   Final   - Negative for intraepithelial lesion or malignancy (NILM)    Review of Systems:  Pertinent items noted in HPI and remainder of comprehensive ROS otherwise negative.  Physical Exam:  BP 130/69   Pulse 80   Wt 107 lb 14.4 oz (48.9 kg)   LMP 04/04/2021 (Exact Date)   Breastfeeding No   BMI 19.11 kg/m  CONSTITUTIONAL: Well-developed, well-nourished female in no acute distress.  HEENT:  Normocephalic, atraumatic. External right and left ear normal. No scleral icterus.  NECK: Normal range of motion, supple, no masses noted on observation SKIN: No rash noted. Not diaphoretic. No erythema. No  pallor. MUSCULOSKELETAL: Normal range of motion. No edema noted. NEUROLOGIC: Alert and oriented to person, place, and time. Normal muscle tone coordination. No cranial nerve deficit noted. PSYCHIATRIC: Normal mood and affect. Normal behavior. Normal judgment and thought content.  BREAST: breasts appear normal, no suspicious masses, no skin or nipple changes or axillary nodes.   CARDIOVASCULAR: Normal heart rate noted RESPIRATORY: Effort and breath sounds normal, no problems with respiration noted ABDOMEN: No masses noted. No other overt distention noted.    PELVIC: Deferred  Labs and Imaging No breast imaging in care everywhere Assessment and Plan:    1. Breast pain - She needs screening mammogram but also [place]. She may also need diagnostic mammogram - She is not nursing - Exam is normal but we discussed imaging in context of [facility] and age.   2. Cold sore - will send in valtrex 500 bid PO for 3-5 days   Routine preventative health maintenance measures emphasized. Please refer to After Visit Summary for other counseling recommendations.   Return if symptoms worsen or fail to improve.  Milas Hock, MD, FACOG Obstetrician & Gynecologist, [facility] for [facility], [facility]

## 2021-04-04 ENCOUNTER — Encounter: Payer: Self-pay | Admitting: Obstetrics and Gynecology

## 2021-04-04 ENCOUNTER — Other Ambulatory Visit: Payer: Self-pay

## 2021-04-04 ENCOUNTER — Ambulatory Visit (INDEPENDENT_AMBULATORY_CARE_PROVIDER_SITE_OTHER): Payer: Medicaid Other | Admitting: Obstetrics and Gynecology

## 2021-04-04 VITALS — BP 130/69 | HR 80 | Wt 107.9 lb

## 2021-04-04 DIAGNOSIS — N644 Mastodynia: Secondary | ICD-10-CM | POA: Diagnosis not present

## 2021-04-04 DIAGNOSIS — B001 Herpesviral vesicular dermatitis: Secondary | ICD-10-CM | POA: Diagnosis not present

## 2021-04-04 MED ORDER — VALACYCLOVIR HCL 500 MG PO TABS: 500.0000 mg | ORAL_TABLET | Freq: Two times a day (BID) | ORAL | 0 refills | Status: DC

## 2021-04-05 ENCOUNTER — Other Ambulatory Visit: Payer: Self-pay | Admitting: Obstetrics and Gynecology

## 2021-04-05 ENCOUNTER — Other Ambulatory Visit: Payer: Self-pay

## 2021-04-05 DIAGNOSIS — N644 Mastodynia: Secondary | ICD-10-CM

## 2021-04-05 NOTE — Progress Notes (Signed)
Diagnostic MM order placed. [facility] unable to schedule [place] without diagnostic MM. Pt notified so she may schedule appt.

## 2021-05-11 IMAGING — US USMFM FETAL BPP W/O NON-STRESS ADDL GEST
1 series · 12 of 28 positions shown · non-contrast
Comparison: none

[Series 1: usmfm fetal bpp w/o non-stress addl gest · 40 acquisitions, 12 frames shown]
[im 2/40]
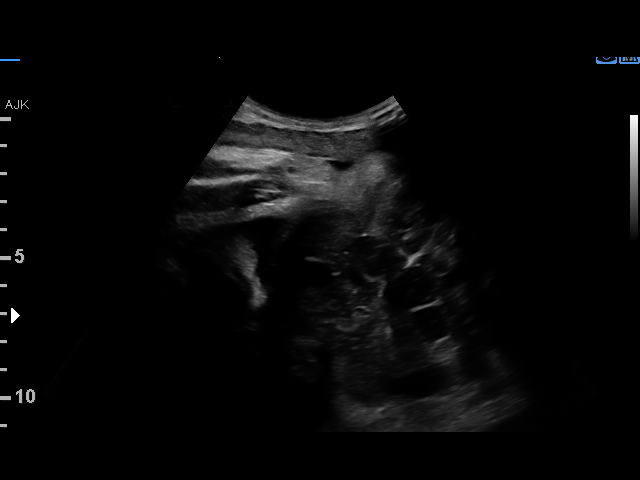
[im 5/40]
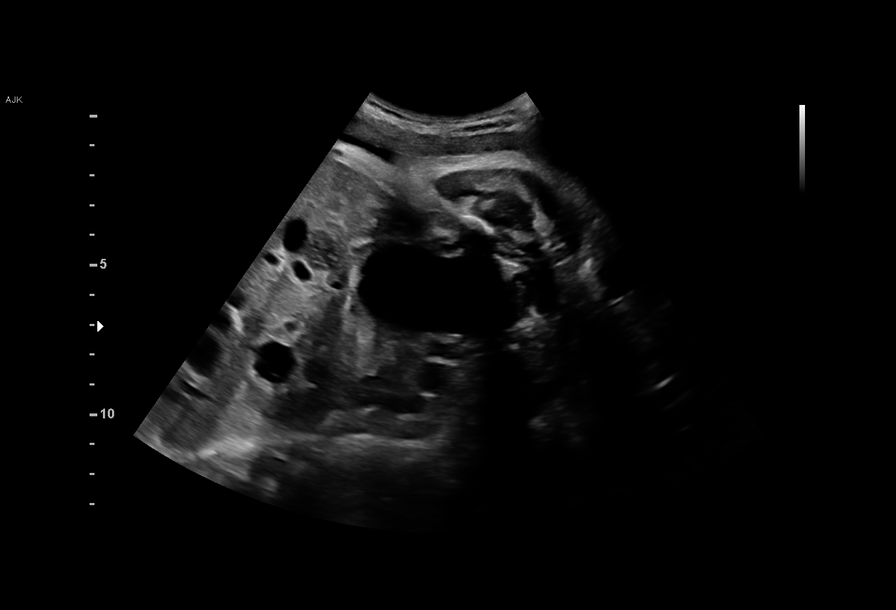
[im 8/40]
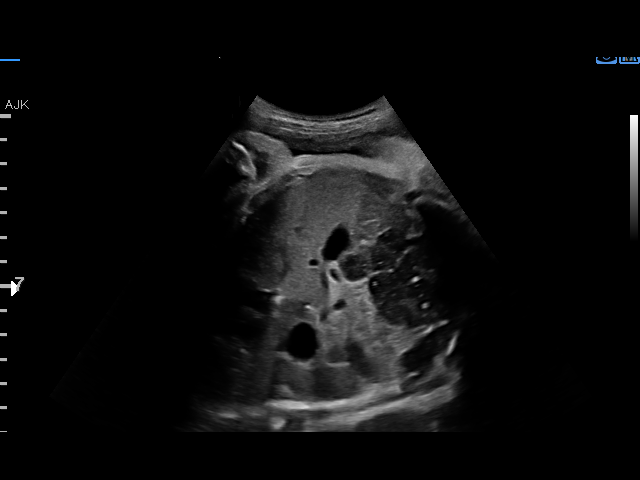
[im 12/40]
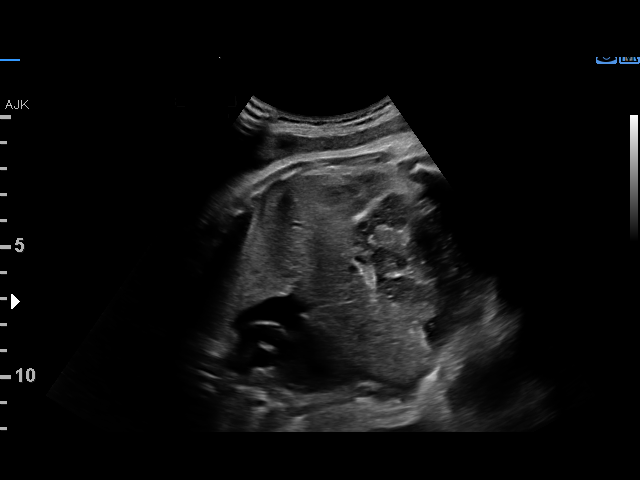
[im 15/40]
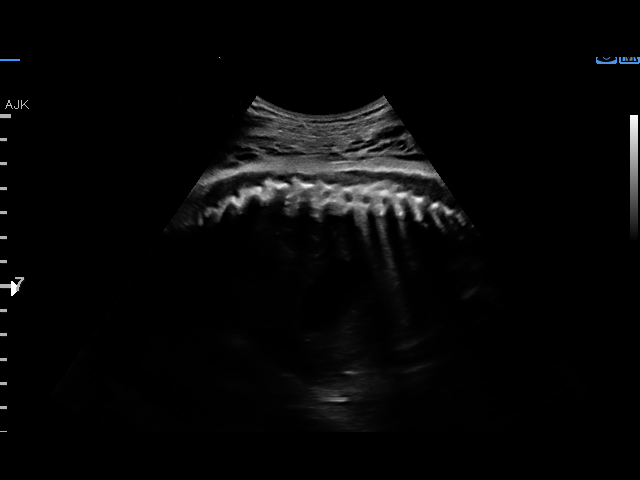
[im 18/40]
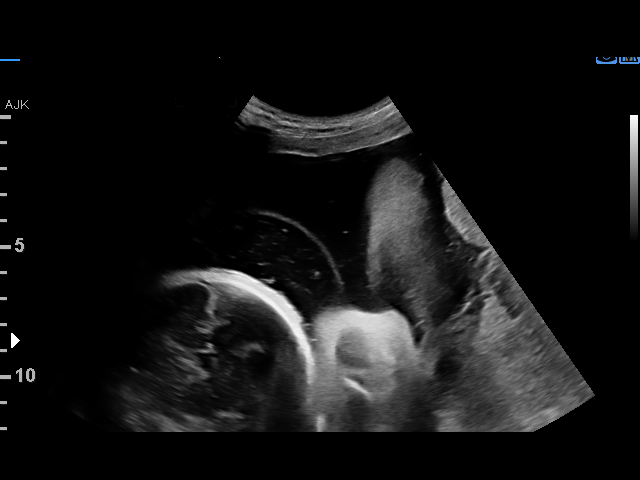
[im 22/40]
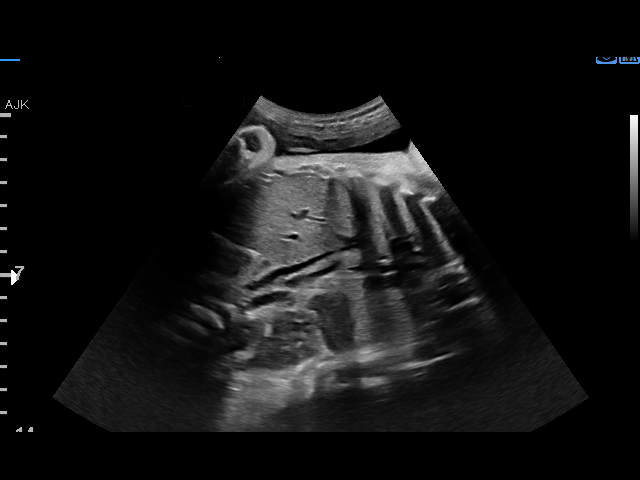
[im 25/40]
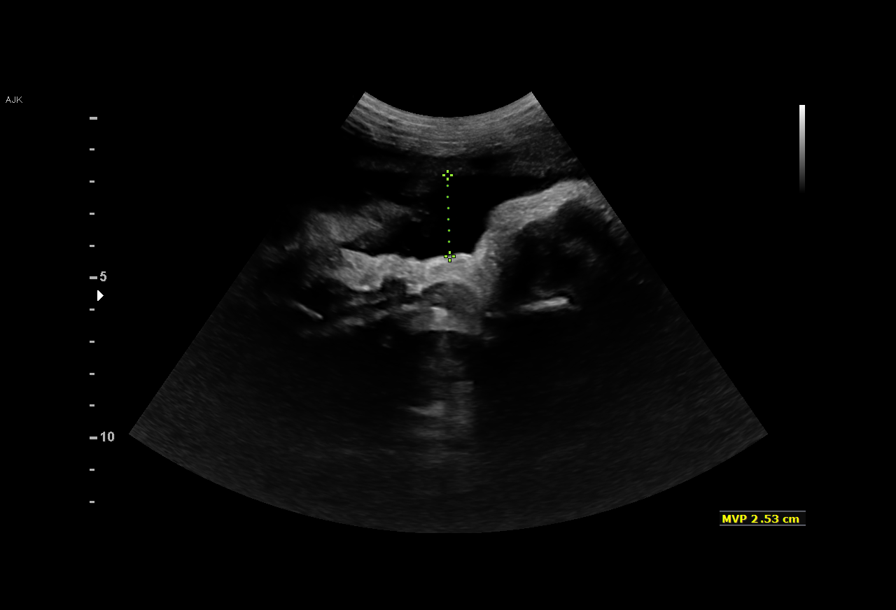
[im 28/40]
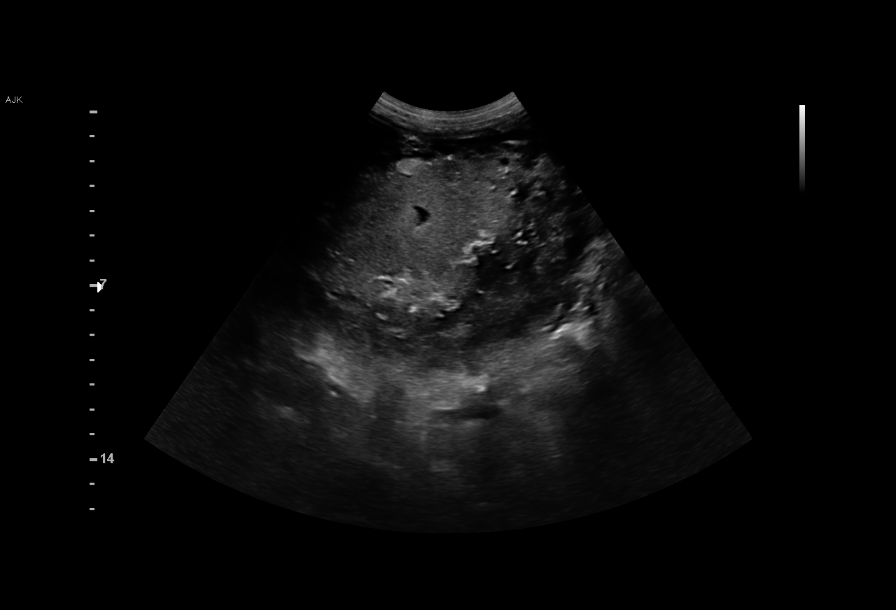
[im 32/40]
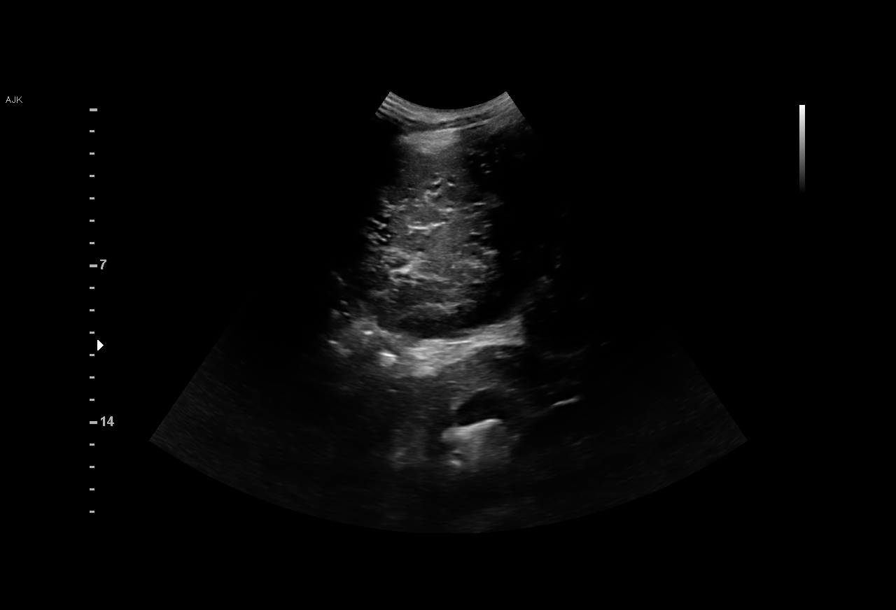
[im 35/40]
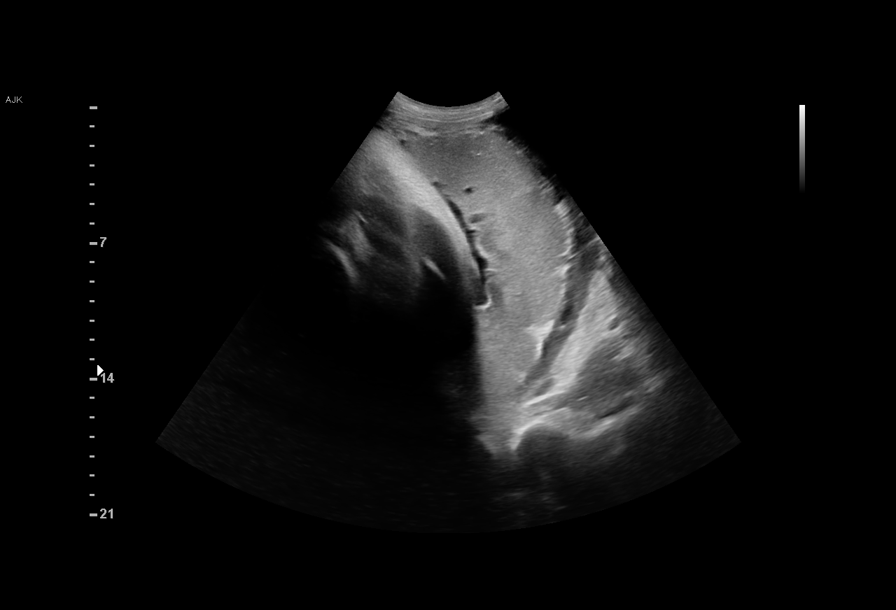
[im 38/40]
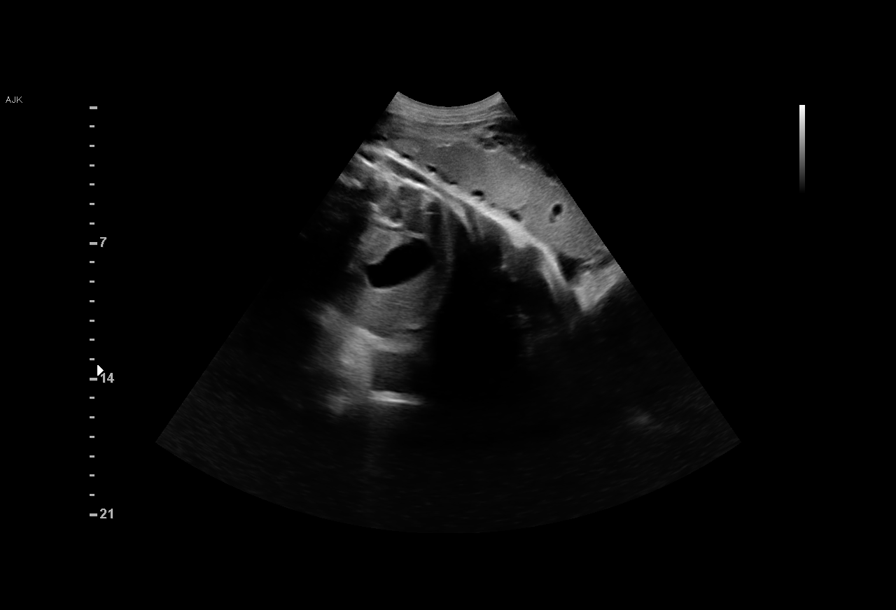

[12 of 28 positions shown; findings below may reference images not displayed]

ADDL GESTATION

Indications

 Twin pregnancy, Curl/Erxleben, third trimester
 34 weeks gestation of pregnancy
 Advanced maternal age multigravida 35+,
 third trimester
 Marginal insertion of umbilical cord affecting
 management of mother in third trimester
 (twin A)
 Velamentous insertion of umbilical cord (twin
 B)
Vital Signs

                                                Height:        5'3"
Fetal Evaluation (Fetus A)

 Num Of Fetuses:         2
 Fetal Heart Rate(bpm):  137
 Cardiac Activity:       Observed
 Fetal Lie:              Lower Fetus
 Presentation:           Cephalic
 Placenta:               Posterior
 P. Cord Insertion:      Previously Visualized
 Membrane Desc:      Dividing Membrane seen - Monochorionic
 Amniotic Fluid
 AFI FV:      Within normal limits

                             Largest Pocket(cm)


 Comment:    Stomach, bladder, and diaphragm seen.
Biophysical Evaluation (Fetus A)

 Amniotic F.V:   Within normal limits       F. Tone:        Observed
 F. Movement:    Observed                   Score:          [DATE]
 F. Breathing:   Observed
OB History

 Gravidity:    4         Term:   3
 Living:       3
Gestational Age (Fetus A)

 LMP:           34w 6d        Date:  04/19/19                 EDD:   01/24/20
 Best:          34w 6d     Det. By:  LMP  (04/19/19)          EDD:   01/24/20

Fetal Evaluation (Fetus B)

 Num Of Fetuses:         2
 Fetal Heart Rate(bpm):  154
 Cardiac Activity:       Observed
 Fetal Lie:              Upper Fetus
 Presentation:           Breech
 Placenta:               Posterior
 P. Cord Insertion:      Previously Visualized
 Membrane Desc:      Dividing Membrane seen - Monochorionic

 Amniotic Fluid
 AFI FV:      Within normal limits

                             Largest Pocket(cm)


 Comment:    Stomach, bladder, and diaphragm seen.
Biophysical Evaluation (Fetus B)

 Amniotic F.V:   Within normal limits       F. Tone:        Observed
 F. Movement:    Observed                   Score:          [DATE]
 F. Breathing:   Observed
Gestational Age (Fetus B)

 LMP:           34w 6d        Date:  04/19/19                 EDD:   01/24/20
 Best:          34w 6d     Det. By:  LMP  (04/19/19)          EDD:   01/24/20
Comments

 This patient was seen for a biophysical profile due to a
 monochorionic, dichorionic twin gestation.  She denies any
 problems since her last exam.
 A biophysical profile performed today was [DATE] for both
 twin A and twin B.
 There was normal amniotic fluid noted on today's ultrasound
 exam around both fetuses.  There were no signs of the twin
 to twin transfusion syndrome noted today.
 Another biophysical profile was scheduled in 1 week.
 Due to the monochorionic twin gestation, she already has a
 delivery scheduled at around 37 weeks.

## 2021-05-18 IMAGING — US US MFM FETAL BPP W/O NON-STRESS
1 series · 12 of 28 positions shown · non-contrast
Comparison: none

[Series 1: us mfm fetal bpp w/o non-stress · 39 acquisitions, 12 frames shown]
[im 2/39]
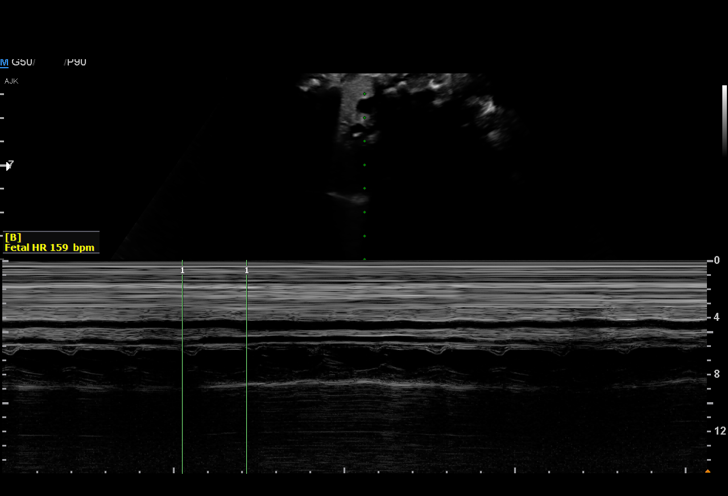
[im 5/39]
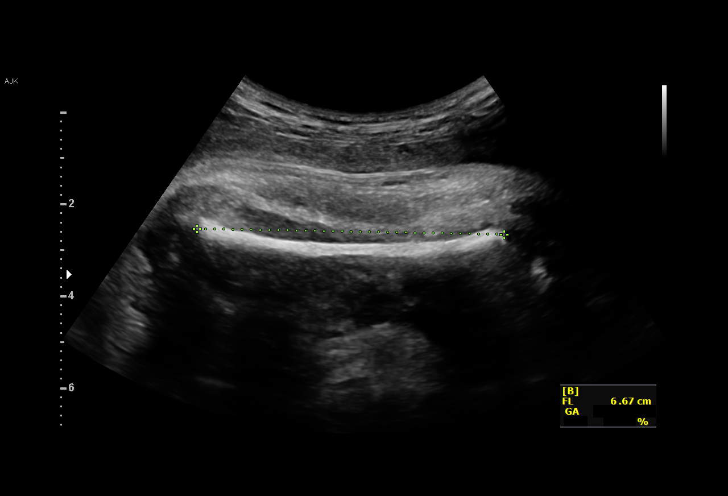
[im 8/39]
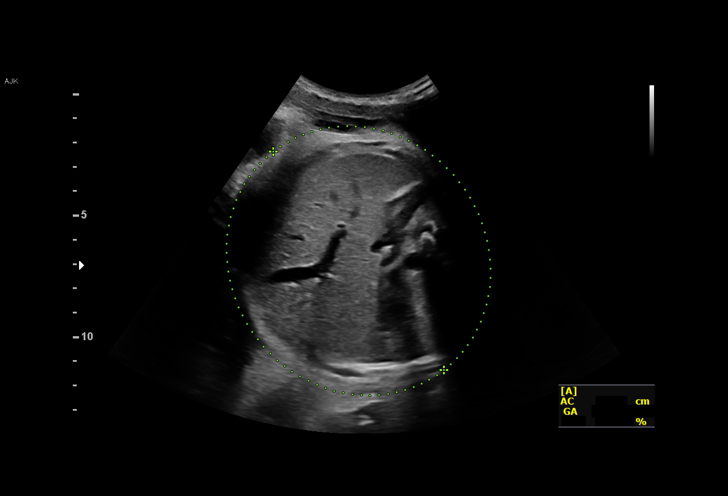
[im 12/39]
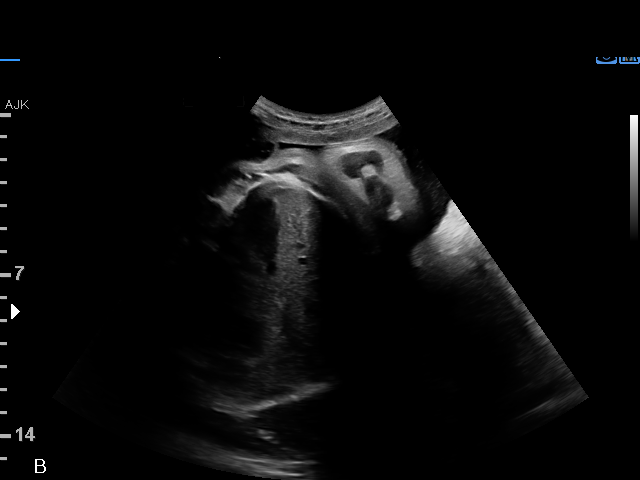
[im 15/39]
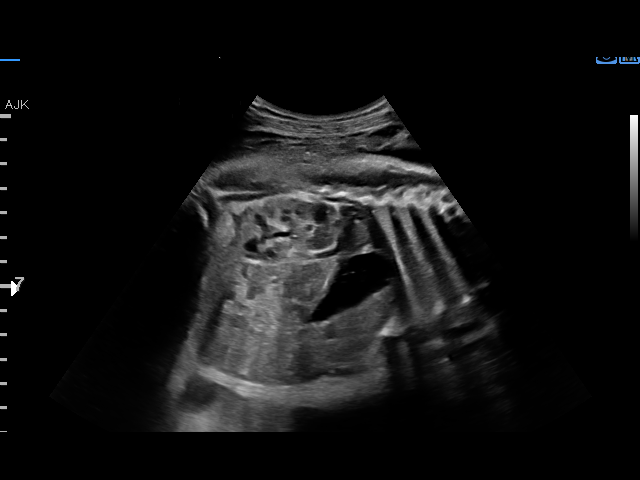
[im 17/39]
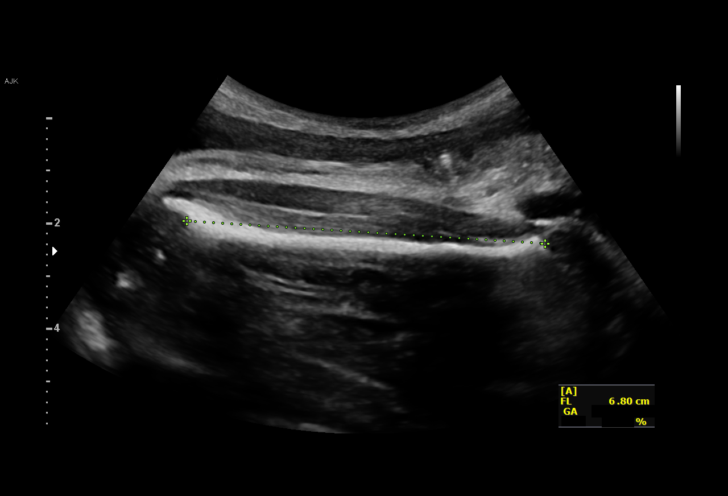
[im 22/39]
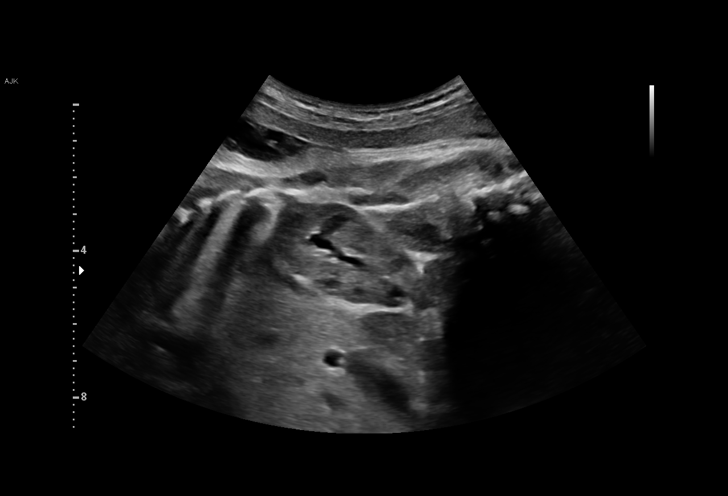
[im 24/39]
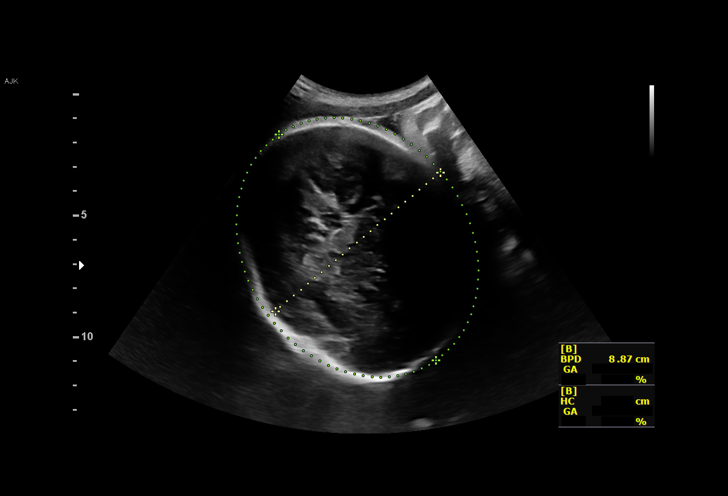
[im 27/39]
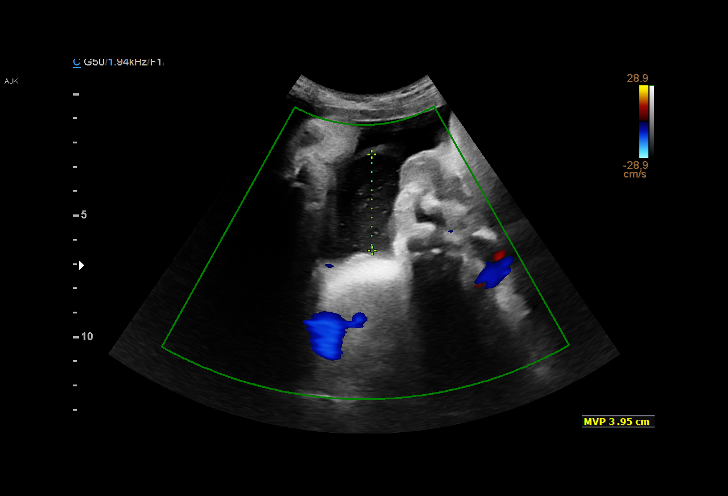
[im 31/39]
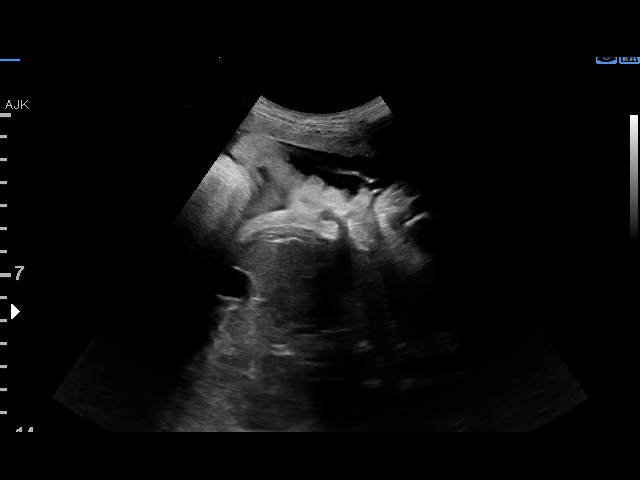
[im 34/39]
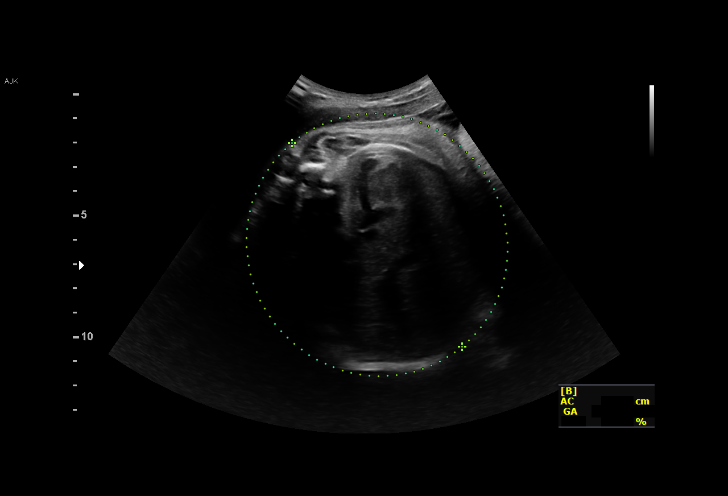
[im 37/39]
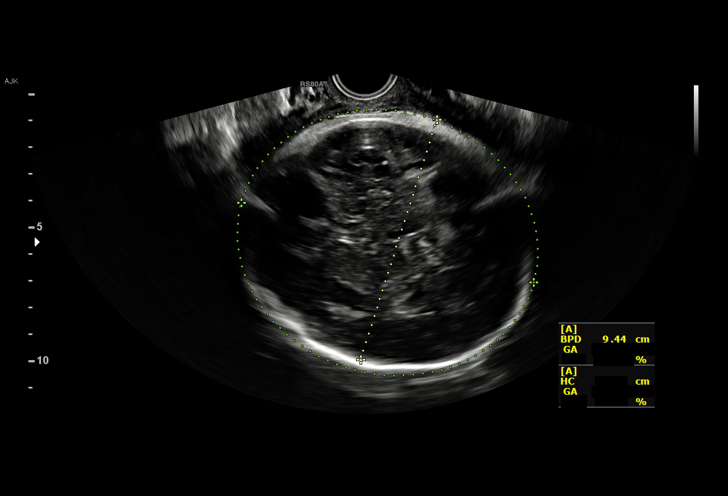

[12 of 28 positions shown; findings below may reference images not displayed]

ADDL GESTATION
    GEST

Indications

 Twin pregnancy, [place]ecita/[place]ue, third trimester
 Marginal insertion of umbilical cord affecting
 management of mother in third trimester
 (twin A)
 35 weeks gestation of pregnancy
 Advanced maternal age multigravida 35+,
 third trimester
 Velamentous insertion of umbilical cord (twin
 B)
Vital Signs

                                                Height:        5'3"
Fetal Evaluation (Fetus A)

 Num Of Fetuses:         2
 Fetal Heart Rate(bpm):  135
 Cardiac Activity:       Observed
 Fetal Lie:              Lower Fetus
 Presentation:           Cephalic
 Placenta:               Posterior

 Amniotic Fluid
 AFI FV:      Within normal limits

                             Largest Pocket(cm)

Biophysical Evaluation (Fetus A)

 Amniotic F.V:   Within normal limits       F. Tone:        Observed
 F. Movement:    Observed                   Score:          [DATE]
 F. Breathing:   Observed
Biometry (Fetus A)

 BPD:      94.2  mm     G. Age:  38w 3d         98  %    CI:        79.55   %    70 - 86
                                                         FL/HC:      20.3   %    20.1 -
 HC:      333.8  mm     G. Age:  38w 1d         75  %    HC/AC:      0.96        0.93 -
 AC:      348.8  mm     G. Age:  38w 5d       > 99  %    FL/BPD:     72.1   %    71 - 87
 FL:       67.9  mm     G. Age:  34w 6d         21  %    FL/AC:      19.5   %    20 - 24

 Est. FW:    9979  gm      7 lb 5 oz     93  %     FW Discordancy      0 \ 8 %
OB History

 Gravidity:    4         Term:   3
 Living:       3
Gestational Age (Fetus A)

 LMP:           35w 6d        Date:  04/19/19                 EDD:   01/24/20
 U/S Today:     37w 4d                                        EDD:   01/12/20
 Best:          35w 6d     Det. By:  LMP  (04/19/19)          EDD:   01/24/20
Anatomy (Fetus A)

 Cranium:               Appears normal         Aortic Arch:            Previously seen
 Cavum:                 Previously seen        Ductal Arch:            Not well visualized
 Ventricles:            Previously seen        Diaphragm:              Appears normal
 Choroid Plexus:        Previously seen        Stomach:                Appears normal, left
                                                                       sided
 Cerebellum:            Previously seen        Abdomen:                Appears normal
 Posterior Fossa:       Previously seen        Abdominal Wall:         Previously seen
 Nuchal Fold:           Not applicable (>20    Cord Vessels:           Not well visualized
                        wks GA)
 Face:                  Profile previously     Kidneys:                Appear normal
                        seen
 Lips:                  Previously seen        Bladder:                Appears normal
 Thoracic:              Appears normal         Spine:                  Previously seen
 Heart:                 Previously seen        Upper Extremities:      Previously seen
 RVOT:                  Previously seen        Lower Extremities:      Previously seen
 LVOT:                  Previously seen

 Other:  5th digit prev visualized. Technically difficult due to fetal position.
Fetal Evaluation (Fetus B)
 Num Of Fetuses:         2
 Fetal Heart Rate(bpm):  159
 Cardiac Activity:       Observed
 Fetal Lie:              Upper Fetus
 Presentation:           Breech
 Placenta:               Posterior

 Amniotic Fluid
 AFI FV:      Within normal limits

                             Largest Pocket(cm)

Biophysical Evaluation (Fetus B)

 Amniotic F.V:   Within normal limits       F. Tone:        Observed
 F. Movement:    Observed                   Score:          [DATE]
 F. Breathing:   Observed
Biometry (Fetus B)

 BPD:      88.7  mm     G. Age:  35w 6d         58  %    CI:        74.85   %    70 - 86
                                                         FL/HC:      20.3   %    20.1 -
 HC:      325.3  mm     G. Age:  36w 6d         43  %    HC/AC:      0.95        0.93 -
 AC:      343.3  mm     G. Age:  38w 2d         98  %    FL/BPD:     74.5   %    71 - 87
 FL:       66.1  mm     G. Age:  34w 0d          8  %    FL/AC:      19.3   %    20 - 24

 Est. FW:    1861  gm    6 lb 11 oz      77  %     FW Discordancy         8  %
Gestational Age (Fetus B)

 LMP:           35w 6d        Date:  04/19/19                 EDD:   01/24/20
 U/S Today:     36w 2d                                        EDD:   01/21/20
 Best:          35w 6d     Det. By:  LMP  (04/19/19)          EDD:   01/24/20
Anatomy (Fetus B)

 Cranium:               Appears normal         Aortic Arch:            Not well visualized
 Cavum:                 Previously seen        Ductal Arch:            Not well visualized
 Ventricles:            Previously seen        Diaphragm:              Appears normal
 Choroid Plexus:        Previously seen        Stomach:                Appears normal, left
                                                                       sided
 Cerebellum:            Previously seen        Abdomen:                Appears normal
 Posterior Fossa:       Previously seen        Abdominal Wall:         Previously seen
 Nuchal Fold:           Not applicable (>20    Cord Vessels:           Previously seen
                        wks GA)
 Face:                  Orbits and profile     Kidneys:                Appear normal
                        previously seen
 Lips:                  Previously seen        Bladder:                Appears normal
 Thoracic:              Appears normal         Spine:                  Previously seen
 Heart:                 Previously seen        Upper Extremities:      Previously seen
 RVOT:                  Previously seen        Lower Extremities:      Previously seen
 LVOT:                  Previously seen

 Other:  5th digit prev visualized. Technically difficult due to fetal position.
Comments

 This patient was seen for a biophysical profile and fetal
 growth scan due to a monochorionic, dichorionic twin
 gestation.  She denies any problems since her last exam.
 She reports that her cervix is already 4 cm dilated.
 The EFW's obtained for both twin A and twin B were
 appropriate for her gestational age.  There was normal
 amniotic fluid noted around both fetuses.
 A biophysical profile performed today was [DATE] for both
 twin A and twin B.
 Should she remain undelivered, another biophysical profile
 was scheduled in 1 week.

## 2021-06-02 ENCOUNTER — Ambulatory Visit
Admission: RE | Admit: 2021-06-02 | Discharge: 2021-06-02 | Disposition: A | Discharge status: Home / Self Care | Payer: Medicaid Other | Source: Ambulatory Visit | Attending: Obstetrics and Gynecology | Admitting: Obstetrics and Gynecology

## 2021-06-02 ENCOUNTER — Ambulatory Visit: Payer: Medicaid Other

## 2021-06-02 DIAGNOSIS — N644 Mastodynia: Secondary | ICD-10-CM

## 2021-07-13 ENCOUNTER — Telehealth: Payer: Self-pay | Admitting: Family Medicine

## 2021-07-13 DIAGNOSIS — B001 Herpesviral vesicular dermatitis: Secondary | ICD-10-CM

## 2021-07-13 MED ORDER — VALACYCLOVIR HCL 500 MG PO TABS: 500.0000 mg | ORAL_TABLET | Freq: Two times a day (BID) | ORAL | 0 refills | Status: DC

## 2021-07-13 NOTE — Telephone Encounter (Signed)
Valtrex reordered per Dr. Para March. Called patient to let her know.

## 2021-07-13 NOTE — Telephone Encounter (Signed)
Notified pt that the medication she requested has been sent to her [facility] on [address].  I also informed pt that the medication prescribed is for just outbreaks and not suppression.  Pt verbalized understanding.   Melanie Macias  07/13/21

## 2021-07-13 NOTE — Telephone Encounter (Signed)
This patient was treated for fever blister back in November. She is experiencing the same symptoms again and would like a nurse to give her a call back to see if she can get a refill of the same prescription.

## 2021-07-13 NOTE — Addendum Note (Signed)
Addended by: Faythe Casa on: 07/13/2021 04:33 PM   Modules accepted: Orders

## 2021-09-06 ENCOUNTER — Telehealth: Payer: Self-pay | Admitting: *Deleted

## 2021-09-06 DIAGNOSIS — B001 Herpesviral vesicular dermatitis: Secondary | ICD-10-CM

## 2021-09-06 MED ORDER — VALACYCLOVIR HCL 500 MG PO TABS: 500.0000 mg | ORAL_TABLET | Freq: Two times a day (BID) | ORAL | 0 refills | Status: DC

## 2021-09-06 NOTE — Telephone Encounter (Signed)
Melanie Macias left a voicemail requesting refill Valtrex for fever blister because she  has another fever blister. Last seen in office 11/22. Last refill sent in 07/13/21 for 10 tablets.  ?I discussed with Dr. Crissie Reese and refill approved. I called and notified [name]. She voices understanding. ?[name] ?

## 2021-11-20 ENCOUNTER — Encounter: Payer: Self-pay | Admitting: Obstetrics and Gynecology

## 2021-11-20 DIAGNOSIS — B379 Candidiasis, unspecified: Secondary | ICD-10-CM

## 2021-11-21 MED ORDER — FLUCONAZOLE 150 MG PO TABS: 150.0000 mg | ORAL_TABLET | Freq: Once | ORAL | 0 refills | Status: AC

## 2023-09-11 ENCOUNTER — Other Ambulatory Visit (HOSPITAL_COMMUNITY)
Admission: RE | Admit: 2023-09-11 | Discharge: 2023-09-11 | Disposition: A | Discharge status: Home / Self Care | Source: Ambulatory Visit | Attending: Certified Nurse Midwife | Admitting: Certified Nurse Midwife

## 2023-09-11 ENCOUNTER — Encounter: Payer: Self-pay | Admitting: Certified Nurse Midwife

## 2023-09-11 ENCOUNTER — Ambulatory Visit: Admitting: Certified Nurse Midwife

## 2023-09-11 VITALS — BP 110/73 | HR 70 | Wt 109.6 lb

## 2023-09-11 DIAGNOSIS — Z124 Encounter for screening for malignant neoplasm of cervix: Secondary | ICD-10-CM

## 2023-09-11 DIAGNOSIS — Z01419 Encounter for gynecological examination (general) (routine) without abnormal findings: Secondary | ICD-10-CM | POA: Diagnosis not present

## 2023-09-11 DIAGNOSIS — B001 Herpesviral vesicular dermatitis: Secondary | ICD-10-CM

## 2023-09-11 DIAGNOSIS — Z1231 Encounter for screening mammogram for malignant neoplasm of breast: Secondary | ICD-10-CM | POA: Diagnosis not present

## 2023-09-11 DIAGNOSIS — Z1331 Encounter for screening for depression: Secondary | ICD-10-CM

## 2023-09-11 MED ORDER — VALACYCLOVIR HCL 500 MG PO TABS: 500.0000 mg | ORAL_TABLET | Freq: Two times a day (BID) | ORAL | 0 refills | Status: AC

## 2023-09-11 NOTE — Progress Notes (Signed)
 ANNUAL EXAM Patient name: Melanie Macias MRN [id number]  Date of birth: 11/02/1980 Chief Complaint:   Gynecologic Exam  History of Present Illness:   Melanie Macias is a 43 y.o. [phone] Caucasian female being seen today for a routine annual exam.  Current complaints: none. Doing well, no vaginal bleeding, discharge, discomfort. No questions or concerns.  Patient's last menstrual period was 08/22/2023.   Upstream - 09/11/23 1122       Pregnancy Intention Screening   Does the patient want to become pregnant in the next year? No    Does the patient's partner want to become pregnant in the next year? No    Would the patient like to discuss contraceptive options today? No      Contraception Wrap Up   Current Method Vasectomy    End Method Vasectomy            The pregnancy intention screening data noted above was reviewed. Potential methods of contraception were discussed. The patient elected to proceed with Vasectomy.   Last pap 02/06/2020. Results were: NILM w/ HRHPV negative. H/O abnormal pap: no. Repeating today. Last mammogram: No screening mammograms. Diagnostic mammogram in 2022 during postpartum period. Family h/o breast cancer: yes - maternal aunt      04/04/2021    4:30 PM 12/26/2019   12:09 PM 12/12/2019   11:27 AM 12/01/2019    4:13 PM 10/30/2019    8:35 AM  Depression screen PHQ 2/9  Decreased Interest 0 0 0 0 0  Down, Depressed, Hopeless 0 0 0 0 0  PHQ - 2 Score 0 0 0 0 0  Altered sleeping 0 0 0 3 0  Tired, decreased energy 0 0 0 3 1  Change in appetite 0 0 0 0 0  Feeling bad or failure about yourself  0 0 0 0 0  Trouble concentrating 0 0 0 0 0  Moving slowly or fidgety/restless 0 0 0 0 0  Suicidal thoughts 0 0 0 0 0  PHQ-9 Score 0 0 0 6 1  Difficult doing work/chores     Not difficult at all        04/04/2021    4:30 PM 12/26/2019   12:09 PM 12/12/2019   11:27 AM 12/01/2019    4:14 PM  GAD 7 : Generalized Anxiety Score  Nervous, Anxious, on Edge 0 0 0 0   Control/stop worrying 0 0 0 0  Worry too much - different things 0 0 0 0  Trouble relaxing 0 0 0 0  Restless 0 0 0 0  Easily annoyed or irritable 0 0 0 0  Afraid - awful might happen 0 0 0 0  Total GAD 7 Score 0 0 0 0     Review of Systems:   Pertinent items are noted in HPI Denies any headaches, blurred vision, fatigue, shortness of breath, chest pain, abdominal pain, abnormal vaginal discharge/itching/odor/irritation, problems with periods, bowel movements, urination, or intercourse unless otherwise stated above.  Pertinent History Reviewed:  Reviewed past medical,surgical, social and family history.  Reviewed problem list, medications and allergies.  Physical Assessment:   Vitals:   09/11/23 1118  BP: 110/73  Pulse: 70  Weight: 109 lb 9.6 oz (49.7 kg)   Body mass index is 19.41 kg/m.   Physical Examination:  General appearance - well appearing, and in no distress Mental status - alert, oriented to person, place, and time Psych:  She has a normal mood and affect Skin - warm  and dry, normal color, no suspicious lesions noted Chest - effort normal, no problems with respiration noted Heart - normal rate and regular rhythm Neck:  midline trachea, no thyromegaly or nodules Breasts - breasts appear normal, no suspicious masses, no skin or nipple changes or  axillary nodes Abdomen - soft, nontender, nondistended, no masses or organomegaly Pelvic - VULVA: normal appearing vulva with no masses, tenderness or lesions   VAGINA: normal appearing vagina with normal color and discharge, no lesions   CERVIX: normal appearing cervix without discharge or lesions, no CMT Thin prep pap is done with HR HPV cotesting Extremities:  No swelling or varicosities noted  Chaperone present for exam  No results found for this or any previous visit (from the past 24 hours).  Assessment & Plan:      1. Encounter for well woman exam (Primary) - Cervical cancer screening: Discussed guidelines.  Pap with HPV collected - STD Testing: declines - Birth Control: Discussed options and their risks, benefits and common side effects; discussed VTE with estrogen containing options. Current contraception: vasectomy - Breast Health: Encouraged self breast awareness/SBE. Teaching provided. Discussed limits of clinical breast exam for detecting breast cancer. Agreeable to screening mammogram. - F/U 12 months and prn  - MM 3D SCREENING MAMMOGRAM BILATERAL BREAST; Future  2. Papanicolaou smear for cervical cancer screening - Cytology - PAP( [place])  3. Screening mammogram for breast cancer - MM 3D SCREENING MAMMOGRAM BILATERAL BREAST; Future  4. Cold sore Refill for Valtrex  sent in, patient almost out of previous prescription from 2023. - valACYclovir  (VALTREX ) 500 MG tablet; Take 1 tablet (500 mg total) by mouth 2 (two) times daily.  Dispense: 30 tablet; Refill: 0   - Will follow up results of pap smear and manage accordingly. - Mammogram scheduled - Colon cancer screening will be due next year.  - Routine preventative health maintenance measures emphasized. - Please refer to After Visit Summary for other counseling recommendations.       Mammogram: schedule screening mammo as soon as possible, or sooner if problems Colonoscopy: @ 45yo, or sooner if problems  Orders Placed This Encounter  Procedures   MM 3D SCREENING MAMMOGRAM BILATERAL BREAST    Meds:  Meds ordered this encounter  Medications   valACYclovir  (VALTREX ) 500 MG tablet    Sig: Take 1 tablet (500 mg total) by mouth 2 (two) times daily.    Dispense:  30 tablet    Refill:  0    Follow-up: No follow-ups on file.  Noreene Bearded, PA

## 2023-09-17 LAB — CYTOLOGY - PAP
Comment: NEGATIVE
High risk HPV: NEGATIVE

## 2023-09-18 ENCOUNTER — Encounter: Payer: Self-pay | Admitting: Family Medicine

## 2023-10-02 ENCOUNTER — Ambulatory Visit

## 2023-11-01 ENCOUNTER — Ambulatory Visit
Admission: RE | Admit: 2023-11-01 | Discharge: 2023-11-01 | Disposition: A | Discharge status: Home / Self Care | Source: Ambulatory Visit | Attending: Family Medicine | Admitting: Family Medicine

## 2023-11-01 DIAGNOSIS — Z01419 Encounter for gynecological examination (general) (routine) without abnormal findings: Secondary | ICD-10-CM

## 2023-11-01 DIAGNOSIS — Z1231 Encounter for screening mammogram for malignant neoplasm of breast: Secondary | ICD-10-CM

## 2023-11-05 ENCOUNTER — Ambulatory Visit: Payer: Self-pay | Admitting: Family Medicine
